# Patient Record
Sex: Female | Born: 1935 | Race: White | Hispanic: No | Marital: Married | Smoking: Never smoker
Health system: Southern US, Community
[De-identification: ages and names within clinical notes are randomized; demographics above are authoritative.]

## PROBLEM LIST (undated history)

## (undated) DIAGNOSIS — E785 Hyperlipidemia, unspecified: Secondary | ICD-10-CM

## (undated) DIAGNOSIS — E079 Disorder of thyroid, unspecified: Secondary | ICD-10-CM

## (undated) DIAGNOSIS — I10 Essential (primary) hypertension: Secondary | ICD-10-CM

## (undated) HISTORY — PX: RHINOPLASTY: SUR1284

## (undated) HISTORY — DX: Hyperlipidemia, unspecified: E78.5

## (undated) HISTORY — DX: Essential (primary) hypertension: I10

## (undated) HISTORY — PX: ABDOMINOPLASTY: SUR9

## (undated) HISTORY — PX: ABDOMINAL HYSTERECTOMY: SHX81

## (undated) HISTORY — PX: THYROIDECTOMY: SHX17

## (undated) HISTORY — DX: Disorder of thyroid, unspecified: E07.9

## (undated) HISTORY — PX: TONSILLECTOMY: SUR1361

---

## 2016-03-18 ENCOUNTER — Ambulatory Visit: Payer: Medicare HMO | Admitting: Allergy

## 2018-01-21 LAB — TSH: TSH: 0.09 — AB (ref 0.41–5.90)

## 2018-03-29 LAB — TSH: TSH: 1.02 (ref 0.41–5.90)

## 2018-05-03 LAB — TSH: TSH: 3.7 (ref 0.41–5.90)

## 2018-06-02 NOTE — Progress Notes (Signed)
Patient ID: Ebony HunterSara Egge, female   DOB: [birth year], 85 y.o.   MRN: [id number]            Referring Provider: Darreld McleanLinda Miles  Reason for Appointment:  Hypothyroidism, new visit    History of Present Illness:   Hypothyroidism was first diagnosed in ?  2014  At the time of diagnosis patient had thyroidectomy done for thyroid nodules but no information is available about her thyroid nodule and why total thyroidectomy was done for a benign diagnosis.  She thinks she was told that she had Hashimoto's thyroiditis also  She says that over the last few years her levothyroxine dose has varied between 75 up to 137 mcg She is concerned that her TSH levels have fluctuated between very low levels up to about 33 but no records are available about her dosage regimen over the last few years  In 2019 her dose has been reduced from previous level of 137 mcg Her TSH was normal at 1.1 previously in 5/19 but unclear what dose she was taking Appears that in 9/19 her TSH was low again and her dose was reduced down to 100 mcg, probably taking 112 mcg prior to this.           The patient has been treated with  100 mcg levothyroxine for about 4 months now With this her last TSH was 3.7  With changing the doses of her thyroid supplementation the patient does not think she has felt different Usually does not have any significant change in her energy level or any symptoms of heat or cold intolerance, palpitations, shakiness or dry skin She is feeling fairly good recently  She thinks that she tends to have some hair loss chronically and splitting of her nails Also she thinks her eyebrows do not grow well  The patient takes the thyroid supplement before breakfast without any other supplement or vitamins as well as usually with a glass of water only         Patient's weight history is as follows:  Wt Readings from Last 3 Encounters:  06/03/18 152 lb 3.2 oz (69 kg)    Thyroid function results have been as  follows:  Lab Results  Component Value Date   TSH 2.20 06/03/2018   TSH 3.70 05/03/2018   TSH 1.02 03/29/2018   TSH 0.09 (A) 01/21/2018   FREET4 0.81 06/03/2018     Past Medical History:  Diagnosis Date  . Hyperlipidemia   . Hypertension   . Thyroid disease     Past Surgical History:  Procedure Laterality Date  . THYROIDECTOMY      Family History  Problem Relation Age of Onset  . Thyroid disease Neg Hx     Social History:  reports that she has never smoked. She has never used smokeless tobacco. She reports that she does not drink alcohol or use drugs.  Allergies: Not on File  Allergies as of 06/03/2018   Not on File     Medication List       Accurate as of June 03, 2018  8:52 PM. Always use your most recent med list.        cholecalciferol 25 MCG (1000 UT) tablet Commonly known as:  VITAMIN D3 Take 2,000 Units by mouth daily. TAKE 2 TABLETS (2000MG ) BY MOUTH ONCE DAILY.   diazepam 10 MG tablet Commonly known as:  VALIUM Take 10 mg by mouth daily as needed for anxiety. TAKE 1 TABLET BY MOUTH ONCE DAILY AS NEEDED.  levothyroxine 100 MCG tablet Commonly known as:  SYNTHROID, LEVOTHROID Take 100 mcg by mouth daily before breakfast. TAKE 1 TABLET BY MOUTH ONCE DAILY.   lisinopril 40 MG tablet Commonly known as:  PRINIVIL,ZESTRIL Take 40 mg by mouth daily. TAKE 1 TABLET BY MOUTH ONCE DAILY.   pravastatin 40 MG tablet Commonly known as:  PRAVACHOL Take 40 mg by mouth daily. TAKE 1 TABLET BY MOUTH ONCE DAILY.          Review of Systems  Constitutional: Positive for weight gain.       She has probably gained about 5 pounds  HENT: Positive for trouble swallowing.        Has mild trouble swallowing.  Also some postnasal drip  Eyes:       Sometimes has discomfort in her eyes  Cardiovascular: Negative for palpitations and leg swelling.  Gastrointestinal: Negative for constipation.       Has had some reflux and reportedly also esophageal spasm.   Currently starting Protonix.  Which she takes before her main meal  Endocrine: Negative for fatigue.       No significant heat intolerance, feels warm at night  Musculoskeletal:       Has mild finger joint pains  Skin: Negative for rash.  Neurological: Negative for numbness.  Psychiatric/Behavioral: Positive for insomnia.                Examination:    BP 122/62 (BP Location: Left Arm, Patient Position: Sitting, Cuff Size: Normal)   Pulse 74   Wt 152 lb 3.2 oz (69 kg)   SpO2 98%   GENERAL:  Average build man well-nourished.   No pallor.    Skin:  no rash or significant skin lesions.  EYES:  No prominence of the eyes or swelling of the eyelids  ENT: Oral mucosa and tongue normal.  NECK: No lymphadenopathy  THYROID:  Not palpable.  HEART:  Normal  S1 and S2; no murmur or click.  CHEST:    Lungs: Vescicular breath sounds heard equally.  No crepitations/ wheeze.  ABDOMEN:  No distention.  Liver and spleen not palpable.  No other mass or tenderness.  NEUROLOGICAL: Reflexes are bilaterally normal at biceps, unable to elicit at ankles.  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 No ankle edema present   Assessment:  HYPOTHYROIDISM secondary to thyroidectomy  She has had variable thyroxine requirements over the last 2 years She appears to be requiring lower doses over the last year and now taking 100 mcg daily Although she is not symptomatic she is concerned about the variability in her TSH levels However unclear how her thyroid supplements have been adjusted based on thyroid levels in the past  She is quite compliant with taking her levothyroxine as directed on empty stomach without any interacting medications or supplements  Other medical problems include hypertension, hyperlipidemia, reflux and chronic insomnia followed by PCP  Patient is requesting screening for diabetes  PLAN:  Recheck thyroid level  today to make sure that it is consistently normal compared to the last 2 levels which were done in November and December Discussed that for her age her TSH being in the upper normal range is adequate and not unexpected Also may consider brand-name medication instead of generic levothyroxine if she has variable thyroid levels  Follow-up to be determined based on labs today   Reather Littlerjay Ziyah Cordoba 06/03/2018, 8:52 PM   Consultation note copy sent to the PCP  Note: This office note was prepared with Insurance underwriter. Any transcriptional errors that result from this process are unintentional.   ADDENDUM: TSH is 2.2.  She will stay on 100 mcg levothyroxine and follow-up in 4 months with same-day labs Blood sugar is 98 and A1c 5.9

## 2018-06-03 ENCOUNTER — Ambulatory Visit (INDEPENDENT_AMBULATORY_CARE_PROVIDER_SITE_OTHER): Payer: Medicare PPO | Admitting: Endocrinology

## 2018-06-03 ENCOUNTER — Encounter: Payer: Self-pay | Admitting: Endocrinology

## 2018-06-03 VITALS — BP 122/62 | HR 74 | Wt 152.2 lb

## 2018-06-03 DIAGNOSIS — Z131 Encounter for screening for diabetes mellitus: Secondary | ICD-10-CM

## 2018-06-03 DIAGNOSIS — E890 Postprocedural hypothyroidism: Secondary | ICD-10-CM

## 2018-06-03 LAB — TSH: TSH: 2.2 u[IU]/mL (ref 0.35–4.50)

## 2018-06-03 LAB — HEMOGLOBIN A1C: HEMOGLOBIN A1C: 5.9 % (ref 4.6–6.5)

## 2018-06-03 LAB — T4, FREE: FREE T4: 0.81 ng/dL (ref 0.60–1.60)

## 2018-06-03 LAB — GLUCOSE, RANDOM: Glucose, Bld: 98 mg/dL (ref 70–99)

## 2018-06-04 ENCOUNTER — Other Ambulatory Visit: Payer: Self-pay

## 2018-10-06 ENCOUNTER — Ambulatory Visit: Payer: Medicare PPO | Admitting: Endocrinology

## 2019-08-22 ENCOUNTER — Ambulatory Visit
Admission: RE | Admit: 2019-08-22 | Discharge: 2019-08-22 | Disposition: A | Discharge status: Home / Self Care | Payer: Medicare PPO | Source: Ambulatory Visit | Attending: Family Medicine | Admitting: Family Medicine

## 2019-08-22 ENCOUNTER — Other Ambulatory Visit: Payer: Self-pay | Admitting: Family Medicine

## 2019-08-22 DIAGNOSIS — M545 Low back pain, unspecified: Secondary | ICD-10-CM

## 2019-09-12 ENCOUNTER — Encounter: Payer: Self-pay | Admitting: Student in an Organized Health Care Education/Training Program

## 2019-09-12 ENCOUNTER — Ambulatory Visit: Payer: Medicare PPO | Admitting: Student in an Organized Health Care Education/Training Program

## 2019-09-12 ENCOUNTER — Other Ambulatory Visit: Payer: Self-pay

## 2019-09-12 ENCOUNTER — Ambulatory Visit
Admission: RE | Admit: 2019-09-12 | Discharge: 2019-09-12 | Disposition: A | Discharge status: Home / Self Care | Payer: Medicare PPO | Source: Ambulatory Visit | Attending: Student in an Organized Health Care Education/Training Program | Admitting: Student in an Organized Health Care Education/Training Program

## 2019-09-12 DIAGNOSIS — M51369 Other intervertebral disc degeneration, lumbar region without mention of lumbar back pain or lower extremity pain: Secondary | ICD-10-CM | POA: Insufficient documentation

## 2019-09-12 DIAGNOSIS — M542 Cervicalgia: Secondary | ICD-10-CM | POA: Insufficient documentation

## 2019-09-12 DIAGNOSIS — M25511 Pain in right shoulder: Secondary | ICD-10-CM | POA: Insufficient documentation

## 2019-09-12 DIAGNOSIS — M5412 Radiculopathy, cervical region: Secondary | ICD-10-CM | POA: Insufficient documentation

## 2019-09-12 DIAGNOSIS — M25512 Pain in left shoulder: Secondary | ICD-10-CM

## 2019-09-12 DIAGNOSIS — M47816 Spondylosis without myelopathy or radiculopathy, lumbar region: Secondary | ICD-10-CM | POA: Insufficient documentation

## 2019-09-12 DIAGNOSIS — G894 Chronic pain syndrome: Secondary | ICD-10-CM | POA: Insufficient documentation

## 2019-09-12 DIAGNOSIS — M5136 Other intervertebral disc degeneration, lumbar region: Secondary | ICD-10-CM

## 2019-09-12 MED ORDER — TRAMADOL HCL 50 MG PO TABS: 50.0000 mg | ORAL_TABLET | Freq: Every day | ORAL | 0 refills | Status: AC | PRN

## 2019-09-12 MED ORDER — GABAPENTIN 300 MG PO CAPS: 300.0000 mg | ORAL_CAPSULE | Freq: Every day | ORAL | 1 refills | Status: DC

## 2019-09-12 NOTE — Progress Notes (Signed)
Patient: Ebony Anderson  Service Category: E/M  Provider: Gillis Santa, MD  DOB: [birth year]  DOS: 09/12/2019  Referring Provider: Marguerita Merles, MD  MRN: [id number]  Setting: Ambulatory outpatient  PCP: Marguerita Merles, MD  Type: New Patient  Specialty: Interventional Pain Management    Location: Office  Delivery: Face-to-face     Primary Reason(s) for Visit: Encounter for initial evaluation of one or more chronic problems (new to examiner) potentially causing chronic pain, and posing a threat to normal musculoskeletal function. (Level of risk: High) CC: Back Pain (bilateral lumbar )  HPI  Ms. Ebony Anderson is a 86 y.o. year old, female patient, who comes today to see [place] for the first time for an initial evaluation of her chronic pain. She has Lumbar spondylosis; Lumbar degenerative disc disease; Chronic pain syndrome; Neck pain; and Pain of both shoulder joints on their problem list. Today she comes in for evaluation of her Back Pain (bilateral lumbar )  Pain Assessment: Location: Lower, Left, Right Back Radiating: denies Onset: More than a month ago Duration: Chronic pain Quality: Discomfort, Constant(excruciating) Severity: 10-Worst pain ever/10 (subjective, self-reported pain score)  Note: Reported level is inconsistent with clinical observations.                         When using our objective Pain Scale, levels between 6 and 10/10 are said to belong in an emergency room, as it progressively worsens from a 6/10, described as severely limiting, requiring emergency care not usually available at an outpatient pain management facility. At a 6/10 level, communication becomes difficult and requires great effort. Assistance to reach the emergency department may be required. Facial flushing and profuse sweating along with potentially dangerous increases in heart rate and blood pressure will be evident. Effect on ADL: unable to stand for very long. Timing: Constant Modifying factors: nothing currently BP:     HR:    Onset and Duration: Gradual and Present longer than 3 months Cause of pain: Unknown Severity: Getting worse, NAS-11 at its worse: 10/10, NAS-11 at its best: 10/10, NAS-11 now: 10/10 and NAS-11 on the average: 10/10 Timing: During activity or exercise and After activity or exercise Aggravating Factors: Bending and Prolonged standing Alleviating Factors: Lying down, Resting and Warm showers or baths Associated Problems: Day-time cramps and Night-time cramps Quality of Pain: Aching, Agonizing, Annoying, Intermittent, Cramping and Distressing Previous Examinations or Tests: CT scan Previous Treatments: Pool exercises  The patient comes into the clinics today for the first time for a chronic pain management evaluation.    Ms. Ebony Anderson is a very pleasant 86 year old female who presents with a chief complaint of axial low back pain that does not radiate into her buttocks or bilateral legs.  This has been present for many years.  She states that she sustained a coccyx fracture during labor with her first newborn.  She states that this is always been an area that has been painful for her but now she is having more superior pain in her low back.  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  She has been utilizing Tylenol and ibuprofen as needed.  She states that standing for more than 15 minutes causes her to have pain.  Bending forward helps alleviate her pain.  She denies having tried gabapentin.  She has done home physical therapy in the past which she states was somewhat helpful in helping to build her muscle strength.  She has done aquatic  therapy in the past as well.  She denies having tried any injections, she denies having tried gabapentin in the past.  She states that she has found benefit with tramadol.  She had an old prescription she states that she takes 1 when she is going outside of the house to do chores.  She denies any bowel bladder weakness.  She is on Valium 10 mg daily.  This is  prescribed by Dr. Lennox Grumbles.  Historic Controlled Substance Pharmacotherapy Review   The patient  reports no history of drug use. List of all UDS Test(s): No results found for: MDMA, COCAINSCRNUR, [place], [place], CANNABQUANT, THCU, [place] List of other Serum/Urine Drug Screening Test(s):  No results found for: AMPHSCRSER, BARBSCRSER, BENZOSCRSER, COCAINSCRSER, COCAINSCRNUR, PCPSCRSER, PCPQUANT, THCSCRSER, THCU, CANNABQUANT, OPIATESCRSER, OXYSCRSER, PROPOXSCRSER, ETH Historical Background Evaluation: [place] PMP: PDMP reviewed during this encounter. Six (6) year initial data search conducted.              [place] Department of public safety, offender search: Editor, commissioning Information) Non-contributory Risk Assessment Profile: Aberrant behavior: None observed or detected today Risk factors for fatal opioid overdose: concomitant use of Benzodiazepines Fatal overdose hazard ratio (HR): Calculation deferred Non-fatal overdose hazard ratio (HR): Calculation deferred Risk of opioid abuse or dependence: 0.7-3.0% with doses ? 36 MME/day and 6.1-26% with doses ? 120 MME/day. Substance use disorder (SUD) risk level: See below Personal History of Substance Abuse (SUD-Substance use disorder):  Alcohol: Negative  Illegal Drugs: Negative  Rx Drugs: Negative  ORT Risk Level calculation: Low Risk Opioid Risk Tool - 09/12/19 1315      Family History of Substance Abuse   Alcohol  Positive Female    Illegal Drugs  Negative    Rx Drugs  Negative      Personal History of Substance Abuse   Alcohol  Negative    Illegal Drugs  Negative    Rx Drugs  Negative      Age   Age between 84-45 years   No      Psychological Disease   Psychological Disease  Negative    Depression  Negative      Total Score   Opioid Risk Tool Scoring  1    Opioid Risk Interpretation  Low Risk      ORT Scoring interpretation table:  Score <3 = Low Risk for SUD  Score between 4-7 = Moderate Risk for SUD  Score >8 = High Risk for Opioid  Abuse   PHQ-2 Depression Scale:  Total score:    PHQ-2 Scoring interpretation table: (Score and probability of major depressive disorder)  Score 0 = No depression  Score 1 = 15.4% Probability  Score 2 = 21.1% Probability  Score 3 = 38.4% Probability  Score 4 = 45.5% Probability  Score 5 = 56.4% Probability  Score 6 = 78.6% Probability   PHQ-9 Depression Scale:  Total score:    PHQ-9 Scoring interpretation table:  Score 0-4 = No depression  Score 5-9 = Mild depression  Score 10-14 = Moderate depression  Score 15-19 = Moderately severe depression  Score 20-27 = Severe depression (2.4 times higher risk of SUD and 2.89 times higher risk of overuse)   Pharmacologic Plan: As per protocol, I have not taken over any controlled substance management, pending the results of ordered tests and/or consults.            Initial impression: Pending review of available data and ordered tests.  Meds   Current Outpatient Medications:  .  cholecalciferol (VITAMIN D3) 25 MCG (1000 UT) tablet, Take 4,000 Units by mouth daily. TAKE 2 TABLETS (2000MG) BY MOUTH ONCE DAILY. , Disp: , Rfl:  .  diazepam (VALIUM) 10 MG tablet, Take 10 mg by mouth daily as needed for anxiety. TAKE 1 TABLET BY MOUTH ONCE DAILY AS NEEDED., Disp: , Rfl:  .  levothyroxine (SYNTHROID, LEVOTHROID) 100 MCG tablet, Take 112 mcg by mouth daily before breakfast. TAKE 1 TABLET BY MOUTH ONCE DAILY. , Disp: , Rfl:  .  lisinopril (PRINIVIL,ZESTRIL) 40 MG tablet, Take 40 mg by mouth daily. TAKE 1 TABLET BY MOUTH ONCE DAILY., Disp: , Rfl:  .  pravastatin (PRAVACHOL) 40 MG tablet, Take 40 mg by mouth daily. TAKE 1 TABLET BY MOUTH ONCE DAILY., Disp: , Rfl:  .  esomeprazole (NEXIUM) 40 MG capsule, Take 40 mg by mouth daily., Disp: , Rfl:  .  gabapentin (NEURONTIN) 300 MG capsule, Take 1 capsule (300 mg total) by mouth at bedtime., Disp: 30 capsule, Rfl: 1 .  ketoconazole (NIZORAL) 2 % shampoo, Apply 1 application topically every other day., Disp:  , Rfl:  .  magnesium oxide (MAG-OX) 400 MG tablet, Take 400 mg by mouth as needed., Disp: , Rfl:  .  mometasone (ELOCON) 0.1 % lotion, Apply 0.1 application topically daily., Disp: , Rfl:  .  traMADol (ULTRAM) 50 MG tablet, Take 1 tablet (50 mg total) by mouth daily as needed for severe pain. Month last 30 days., Disp: 30 tablet, Rfl: 0  Imaging Review    Results for orders placed during the hospital encounter of 08/22/19  DG Lumbar Spine Complete   Narrative CLINICAL DATA:  Low back pain and sciatica  EXAM: LUMBAR SPINE - COMPLETE 4+ VIEW  COMPARISON:  None.  FINDINGS: Mild curvature of the thoracolumbar spine convex left centered at the thoracolumbar junction. Mild spondylosis throughout the lumbar spine most notable at the L2-3 level. Moderate facet arthropathy over the lower lumbar spine. No evidence of compression fracture or subluxation. Moderate disc space narrowing at the L2-3 level and to lesser extent at the L3-4 level. Mild calcified plaque over the abdominal aorta and iliac arteries.  IMPRESSION: Mild spondylosis of the lumbar spine to include moderate facet arthropathy over the lower lumbar spine. Moderate disc space narrowing at the L2-3 level and to lesser extent at the L3-4 level.   Electronically Signed   By: Marin Olp M.D.   On: 08/23/2019 08:14            Complexity Note: Imaging results reviewed. Results shared with Ms. Ebony Anderson, using Layman's terms.                         ROS  Cardiovascular: Needs antibiotics prior to dental procedures Pulmonary or Respiratory: Snoring  Neurological: No reported neurological signs or symptoms such as seizures, abnormal skin sensations, urinary and/or fecal incontinence, being born with an abnormal open spine and/or a tethered spinal cord Psychological-Psychiatric: No reported psychological or psychiatric signs or symptoms such as difficulty sleeping, anxiety, depression, delusions or hallucinations  (schizophrenial), mood swings (bipolar disorders) or suicidal ideations or attempts Gastrointestinal: Heartburn due to stomach pushing into lungs (Hiatal hernia) and Reflux or heatburn Genitourinary: No reported renal or genitourinary signs or symptoms such as difficulty voiding or producing urine, peeing blood, non-functioning kidney, kidney stones, difficulty emptying the bladder, difficulty controlling the flow of urine, or chronic kidney disease Hematological: No reported hematological signs or symptoms such as prolonged bleeding, low  or poor functioning platelets, bruising or bleeding easily, hereditary bleeding problems, low energy levels due to low hemoglobin or being anemic Endocrine: thyroid removal  Rheumatologic: Rheumatoid arthritis Musculoskeletal: Negative for myasthenia gravis, muscular dystrophy, multiple sclerosis or malignant hyperthermia Work History: Retired  Allergies  Ms. Ebony Anderson is allergic to aspirin; codeine; and sulfa antibiotics.  Laboratory Chemistry Profile   Renal No results found for: BUN, CREATININE, LABCREA, BCR, GFR, GFRAA, GFRNONAA, SPECGRAV, PHUR, PROTEINUR   Electrolytes No results found for: NA, K, CL, CALCIUM, MG, PHOS   Hepatic No results found for: AST, ALT, ALBUMIN, ALKPHOS, AMYLASE, LIPASE, AMMONIA   ID No results found for: LYMEIGGIGMAB, HIV, SARSCOV2NAA, STAPHAUREUS, MRSAPCR, HCVAB, PREGTESTUR, RMSFIGG, QFVRPH1IGG, QFVRPH2IGG, LYMEIGGIGMAB   Bone No results found for: VD25OH, VE720NO7SJG, GE3662HU7, ML4650PT4, 25OHVITD1, 25OHVITD2, 25OHVITD3, TESTOFREE, TESTOSTERONE   Endocrine Lab Results  Component Value Date   GLUCOSE 98 06/03/2018   HGBA1C 5.9 06/03/2018   TSH 2.20 06/03/2018   FREET4 0.81 06/03/2018     Neuropathy Lab Results  Component Value Date   HGBA1C 5.9 06/03/2018     CNS No results found for: COLORCSF, APPEARCSF, RBCCOUNTCSF, WBCCSF, POLYSCSF, LYMPHSCSF, EOSCSF, PROTEINCSF, GLUCCSF, JCVIRUS, CSFOLI, IGGCSF, LABACHR,  ACETBL, LABACHR, ACETBL   Inflammation (CRP: Acute  ESR: Chronic) No results found for: CRP, ESRSEDRATE, LATICACIDVEN   Rheumatology No results found for: RF, ANA, LABURIC, URICUR, LYMEIGGIGMAB, LYMEABIGMQN, HLAB27   Coagulation No results found for: INR, LABPROT, APTT, PLT, DDIMER, LABHEMA, VITAMINK1, AT3   Cardiovascular No results found for: BNP, CKTOTAL, CKMB, TROPONINI, HGB, HCT, LABVMA, EPIRU, EPINEPH24HUR, NOREPRU, NOREPI24HUR, DOPARU, DOPAM24HRUR   Screening No results found for: SARSCOV2NAA, COVIDSOURCE, STAPHAUREUS, MRSAPCR, HCVAB, HIV, PREGTESTUR   Cancer No results found for: CEA, CA125, LABCA2   Allergens No results found for: ALMOND, APPLE, ASPARAGUS, AVOCADO, BANANA, BARLEY, BASIL, B[place]LE[place], GREENBEAN, LIMABEAN, WHITEBEAN, BEEFIGE, REDBEET, BLUEBERRY, BROCCOLI, CABBAGE, MELON, CARROT, CASEIN, CASHEWNUT, CAULIFLOWER, CELERY     Note: Lab results reviewed.   [place]  Drug: Ms. Ebony Anderson  reports no history of drug use. Alcohol:  reports no history of alcohol use. Tobacco:  reports that she has never smoked. She has never used smokeless tobacco. Medical:  has a past medical history of Hyperlipidemia, Hypertension, and Thyroid disease. Family: family history is not on file.  Past Surgical History:  Procedure Laterality Date  . THYROIDECTOMY     Active Ambulatory Problems    Diagnosis Date Noted  . Lumbar spondylosis 09/12/2019  . Lumbar degenerative disc disease 09/12/2019  . Chronic pain syndrome 09/12/2019  . Neck pain 09/12/2019  . Pain of both shoulder joints 09/12/2019   Resolved Ambulatory Problems    Diagnosis Date Noted  . No Resolved Ambulatory Problems   Past Medical History:  Diagnosis Date  . Hyperlipidemia   . Hypertension   . Thyroid disease    Constitutional Exam  General appearance: Well nourished, well developed, and well hydrated. In no apparent acute distress There were no vitals filed for this visit. BMI Assessment: There is no height  or weight on file to calculate BMI.  BMI interpretation table: BMI level Category Range association with higher incidence of chronic pain  <18 kg/m2 Underweight   18.5-24.9 kg/m2 Ideal body weight   25-29.9 kg/m2 Overweight Increased incidence by 20%  30-34.9 kg/m2 Obese (Class I) Increased incidence by 68%  35-39.9 kg/m2 Severe obesity (Class II) Increased incidence by 136%  >40 kg/m2 Extreme obesity (Class III) Increased incidence by 254%   Patient's current BMI Ideal Body weight  There is no height or weight on file to calculate BMI. Patient weight not recorded   BMI Readings from Last 4 Encounters:  No data found for BMI   Wt Readings from Last 4 Encounters:  06/03/18 152 lb 3.2 oz (69 kg)    Psych/Mental status: Alert, oriented x 3 (person, place, & time)       Eyes: PERLA Respiratory: No evidence of acute respiratory distress  Cervical Spine Exam  Skin & Axial Inspection: No masses, redness, edema, swelling, or associated skin lesions Alignment: Symmetrical Functional ROM: Unrestricted ROM      Stability: No instability detected Muscle Tone/Strength: Functionally intact. No obvious neuro-muscular anomalies detected. Sensory (Neurological): Unimpaired Palpation: No palpable anomalies              Upper Extremity (UE) Exam    Side: Right upper extremity  Side: Left upper extremity  Skin & Extremity Inspection: Skin color, temperature, and hair growth are WNL. No peripheral edema or cyanosis. No masses, redness, swelling, asymmetry, or associated skin lesions. No contractures.  Skin & Extremity Inspection: Skin color, temperature, and hair growth are WNL. No peripheral edema or cyanosis. No masses, redness, swelling, asymmetry, or associated skin lesions. No contractures.  Functional ROM: Unrestricted ROM          Functional ROM: Unrestricted ROM          Muscle Tone/Strength: Functionally intact. No obvious neuro-muscular anomalies detected.  Muscle Tone/Strength:  Functionally intact. No obvious neuro-muscular anomalies detected.  Sensory (Neurological): Unimpaired          Sensory (Neurological): Unimpaired          Palpation: No palpable anomalies              Palpation: No palpable anomalies              Provocative Test(s):  Phalen's test: deferred Tinel's test: deferred Apley's scratch test (touch opposite shoulder):  Action 1 (Across chest): deferred Action 2 (Overhead): deferred Action 3 (LB reach): deferred   Provocative Test(s):  Phalen's test: deferred Tinel's test: deferred Apley's scratch test (touch opposite shoulder):  Action 1 (Across chest): deferred Action 2 (Overhead): deferred Action 3 (LB reach): deferred    Thoracic Spine Area Exam  Skin & Axial Inspection: No masses, redness, or swelling Alignment: Symmetrical Functional ROM: Unrestricted ROM Stability: No instability detected Muscle Tone/Strength: Functionally intact. No obvious neuro-muscular anomalies detected. Sensory (Neurological): Unimpaired Muscle strength & Tone: No palpable anomalies  Lumbar Exam  Skin & Axial Inspection: No masses, redness, or swelling Alignment: Symmetrical Functional ROM: Pain restricted ROM affecting both sides Stability: No instability detected Muscle Tone/Strength: Functionally intact. No obvious neuro-muscular anomalies detected. Sensory (Neurological): Musculoskeletal pain pattern Palpation: No palpable anomalies       Provocative Tests: Hyperextension/rotation test: (+) bilaterally for facet joint pain. Lumbar quadrant test (Kemp's test): (+) bilaterally for facet joint pain. Lateral bending test: deferred today       Patrick's Maneuver: deferred today                   FABER* test: deferred today                   S-I anterior distraction/compression test: deferred today         S-I lateral compression test: deferred today         S-I Thigh-thrust test: deferred today         S-I Gaenslen's test: deferred today          *(  Flexion, ABduction and External Rotation)  Gait & Posture Assessment  Ambulation: Unassisted Gait: Relatively normal for age and body habitus Posture: WNL   Lower Extremity Exam    Side: Right lower extremity  Side: Left lower extremity  Stability: No instability observed          Stability: No instability observed          Skin & Extremity Inspection: Skin color, temperature, and hair growth are WNL. No peripheral edema or cyanosis. No masses, redness, swelling, asymmetry, or associated skin lesions. No contractures.  Skin & Extremity Inspection: Skin color, temperature, and hair growth are WNL. No peripheral edema or cyanosis. No masses, redness, swelling, asymmetry, or associated skin lesions. No contractures.  Functional ROM: Pain restricted ROM for hip and knee joints          Functional ROM: Pain restricted ROM for hip and knee joints          Muscle Tone/Strength: Functionally intact. No obvious neuro-muscular anomalies detected.  Muscle Tone/Strength: Functionally intact. No obvious neuro-muscular anomalies detected.  Sensory (Neurological): Unimpaired        Sensory (Neurological): Unimpaired        DTR: Patellar: 0: absent Achilles: deferred today Plantar: deferred today  DTR: Patellar: 0: absent Achilles: deferred today Plantar: deferred today  Palpation: No palpable anomalies  Palpation: No palpable anomalies   Assessment  Primary Diagnosis & Pertinent Problem List: The primary encounter diagnosis was Lumbar facet arthropathy. Diagnoses of Lumbar spondylosis, Lumbar degenerative disc disease, Neck pain, Pain of both shoulder joints, and Chronic pain syndrome were also pertinent to this visit.  Visit Diagnosis (New problems to examiner): 1. Lumbar facet arthropathy   2. Lumbar spondylosis   3. Lumbar degenerative disc disease   4. Neck pain   5. Pain of both shoulder joints   6. Chronic pain syndrome    General Recommendations: The pain condition that the patient  suffers from is best treated with a multidisciplinary approach that involves an increase in physical activity to prevent de-conditioning and worsening of the pain cycle, as well as psychological counseling (formal and/or informal) to address the co-morbid psychological affects of pain. Treatment will often involve judicious use of pain medications and interventional procedures to decrease the pain, allowing the patient to participate in the physical activity that will ultimately produce long-lasting pain reductions. The goal of the multidisciplinary approach is to return the patient to a higher level of overall function and to restore their ability to perform activities of daily living.  Plan of Care (Initial workup plan)   1. Lumbar facet arthropathy Ebony Anderson has a history of greater than 3 months of moderate to severe pain which is resulted in functional impairment.  The patient has tried various conservative therapeutic options such as NSAIDs, Tylenol, muscle relaxants, physical therapy which was inadequately effective.  Patient's pain is predominantly axial with physical exam findings suggestive of facet arthropathy.  Lumbar facet medial branch nerve blocks were discussed with the patient.  Risks and benefits were reviewed.  Patient would like to proceed with bilateral L3, L4, L5 medial branch nerve block.  - LUMBAR FACET(MEDIAL BRANCH NERVE BLOCK) MBNB; Future  2. Lumbar spondylosis -as above  3. Lumbar degenerative disc disease -continue with home PT exercise  4. Neck pain - DG Cervical Spine With Flex & Extend; Future  5. Pain of both shoulder joints - DG Shoulder Right; Future - DG Shoulder Left; Future  6. Chronic pain syndrome - Compliance Drug Analysis,  Ur - gabapentin (NEURONTIN) 300 MG capsule; Take 1 capsule (300 mg total) by mouth at bedtime.  Dispense: 30 capsule; Refill: 1 - traMADol (ULTRAM) 50 MG tablet; Take 1 tablet (50 mg total) by mouth daily as needed for severe  pain. Month last 30 days.  Dispense: 30 tablet; Refill: 0   Lab Orders     Compliance Drug Analysis, Ur  Imaging Orders     DG Cervical Spine With Flex & Extend     DG Shoulder Right     DG Shoulder Left  Procedure Orders     LUMBAR FACET(MEDIAL BRANCH NERVE BLOCK) MBNB Pharmacotherapy (current): Medications ordered:  Meds ordered this encounter  Medications  . gabapentin (NEURONTIN) 300 MG capsule    Sig: Take 1 capsule (300 mg total) by mouth at bedtime.    Dispense:  30 capsule    Refill:  1  . traMADol (ULTRAM) 50 MG tablet    Sig: Take 1 tablet (50 mg total) by mouth daily as needed for severe pain. Month last 30 days.    Dispense:  30 tablet    Refill:  0    [place] STOP ACT - Not applicable. Fill one day early if pharmacy is closed on scheduled refill date.   Medications administered during this visit: Ritta Ebony Anderson had no medications administered during this visit.   Pharmacological management options:  Opioid Analgesics: The patient was informed that there is no guarantee that she would be a candidate for opioid analgesics. The decision will be made following CDC guidelines. This decision will be based on the results of diagnostic studies, as well as Ms. Ebony Anderson's risk profile.   Membrane stabilizer: To be determined at a later time  Muscle relaxant: To be determined at a later time  NSAID: To be determined at a later time  Other analgesic(s): To be determined at a later time   Interventional management options: Ms. Ebony Anderson was informed that there is no guarantee that she would be a candidate for interventional therapies. The decision will be based on the results of diagnostic studies, as well as Ms. Ebony Anderson's risk profile.  Procedure(s) under consideration:  Lumbar facet medial branch nerve block Pending cervical and shoulder x-rays consider cervical facets, glenohumeral steroid joint, suprascapular nerve block   Provider-requested follow-up: Return in about 2 weeks  (around 09/26/2019) for B/L L3, 4, 5 Facets  , without sedation.  No future appointments.  Note by: Gillis Santa, MD Date: 09/12/2019; Time: 3:22 PM

## 2019-09-12 NOTE — Progress Notes (Signed)
Safety precautions to be maintained throughout the outpatient stay will include: orient to surroundings, keep bed in low position, maintain call bell within reach at all times, provide assistance with transfer out of bed and ambulation.  

## 2019-09-12 NOTE — Patient Instructions (Signed)
____________________________________________________________________________________________  Preparing for your procedure (without sedation)  Procedure appointments are limited to planned procedures: . No Prescription Refills. . No disability issues will be discussed. . No medication changes will be discussed.  Instructions: . Oral Intake: Do not eat or drink anything for at least 3 hours prior to your procedure. (Exception: Blood Pressure Medication. See below.) . Transportation: Unless otherwise stated by your physician, you may drive yourself after the procedure. . Blood Pressure Medicine: Do not forget to take your blood pressure medicine with a sip of water the morning of the procedure. If your Diastolic (lower reading)is above 100 mmHg, elective cases will be cancelled/rescheduled. . Blood thinners: These will need to be stopped for procedures. Notify our staff if you are taking any blood thinners. Depending on which one you take, there will be specific instructions on how and when to stop it. . Diabetics on insulin: Notify the staff so that you can be scheduled 1st case in the morning. If your diabetes requires high dose insulin, take only  of your normal insulin dose the morning of the procedure and notify the staff that you have done so. . Preventing infections: Shower with an antibacterial soap the morning of your procedure.  . Build-up your immune system: Take 1000 mg of Vitamin C with every meal (3 times a day) the day prior to your procedure. . Antibiotics: Inform the staff if you have a condition or reason that requires you to take antibiotics before dental procedures. . Pregnancy: If you are pregnant, call and cancel the procedure. . Sickness: If you have a cold, fever, or any active infections, call and cancel the procedure. . Arrival: You must be in the facility at least 30 minutes prior to your scheduled procedure. . Children: Do not bring any children with you. . Dress  appropriately: Bring dark clothing that you would not mind if they get stained. . Valuables: Do not bring any jewelry or valuables.  Reasons to call and reschedule or cancel your procedure: (Following these recommendations will minimize the risk of a serious complication.) . Surgeries: Avoid having procedures within 2 weeks of any surgery. (Avoid for 2 weeks before or after any surgery). . Flu Shots: Avoid having procedures within 2 weeks of a flu shots or . (Avoid for 2 weeks before or after immunizations). . Barium: Avoid having a procedure within 7-10 days after having had a radiological study involving the use of radiological contrast. (Myelograms, Barium swallow or enema study). . Heart attacks: Avoid any elective procedures or surgeries for the initial 6 months after a "Myocardial Infarction" (Heart Attack). . Blood thinners: It is imperative that you stop these medications before procedures. Let us know if you if you take any blood thinner.  . Infection: Avoid procedures during or within two weeks of an infection (including chest colds or gastrointestinal problems). Symptoms associated with infections include: Localized redness, fever, chills, night sweats or profuse sweating, burning sensation when voiding, cough, congestion, stuffiness, runny nose, sore throat, diarrhea, nausea, vomiting, cold or Flu symptoms, recent or current infections. It is specially important if the infection is over the area that we intend to treat. . Heart and lung problems: Symptoms that may suggest an active cardiopulmonary problem include: cough, chest pain, breathing difficulties or shortness of breath, dizziness, ankle swelling, uncontrolled high or unusually low blood pressure, and/or palpitations. If you are experiencing any of these symptoms, cancel your procedure and contact your primary care physician for an evaluation.  Remember:  Regular   Business hours are:  Monday to Thursday 8:00 AM to 4:00  PM  Provider's Schedule: Milinda Pointer, MD:  Procedure days: Tuesday and Thursday 7:30 AM to 4:00 PM  Gillis Santa, MD:  Procedure days: Monday and Wednesday 7:30 AM to 4:00 PM ____________________________________________________________________________________________   Facet Joint Block The facet joints connect the bones of the spine (vertebrae). They make it possible for you to bend, twist, and make other movements with your spine. They also keep you from bending too far, twisting too far, and making other extreme movements. A facet joint block is a procedure in which a numbing medicine (anesthetic) is injected into a facet joint. In many cases, an anti-inflammatory medicine (steroid) is also injected. A facet joint block may be done:  To diagnose neck or back pain. If the pain gets better after a facet joint block, it means the pain is probably coming from the facet joint. If the pain does not get better, it means the pain is probably not coming from the facet joint.  To relieve neck or back pain that is caused by an inflamed facet joint. A facet joint block is only done to relieve pain if the pain does not improve with other methods, such as medicine, exercise programs, and physical therapy. Tell a health care provider about:  Any allergies you have.  All medicines you are taking, including vitamins, herbs, eye drops, creams, and over-the-counter medicines.  Any problems you or family members have had with anesthetic medicines.  Any blood disorders you have.  Any surgeries you have had.  Any medical conditions you have or have had.  Whether you are pregnant or may be pregnant. What are the risks? Generally, this is a safe procedure. However, problems may occur, including:  Bleeding.  Injury to a nerve near the injection site.  Pain at the injection site.  Weakness or numbness in areas controlled by nerves near the injection site.  Infection.  Temporary fluid  retention.  Allergic reactions to medicines or dyes.  Injury to other structures or organs near the injection site. What happens before the procedure? Medicines Ask your health care provider about:  Changing or stopping your regular medicines. This is especially important if you are taking diabetes medicines or blood thinners.  Taking medicines such as aspirin and ibuprofen. These medicines can thin your blood. Do not take these medicines unless your health care provider tells you to take them.  Taking over-the-counter medicines, vitamins, herbs, and supplements. Eating and drinking Follow instructions from your health care provider about eating and drinking, which may include:  8 hours before the procedure - stop eating heavy meals or foods, such as meat, fried foods, or fatty foods.  6 hours before the procedure - stop eating light meals or foods, such as toast or cereal.  6 hours before the procedure - stop drinking milk or drinks that contain milk.  2 hours before the procedure - stop drinking clear liquids. Staying hydrated Follow instructions from your health care provider about hydration, which may include:  Up to 2 hours before the procedure - you may continue to drink clear liquids, such as water, clear fruit juice, black coffee, and plain tea. General instructions  Do not use any products that contain nicotine or tobacco for at least 4-6 weeks before the procedure. These products include cigarettes, e-cigarettes, and chewing tobacco. If you need help quitting, ask your health care provider.  Plan to have someone take you home from the hospital or clinic.  Ask your health care provider: ? How your surgery site will be marked. ? What steps will be taken to help prevent infection. These may include:  Removing hair at the surgery site.  Washing skin with a germ-killing soap.  Receiving antibiotic medicine. What happens during the procedure?   You will put on a  hospital gown.  You will lie on your stomach on an X-ray table. You may be asked to lie in a different position if an injection will be made in your neck.  Machines will be used to monitor your oxygen levels, heart rate, and blood pressure.  Your skin will be cleaned.  If an injection will be made in your neck, an IV will be inserted into one of your veins. Fluids and medicine will flow directly into your body through the IV.  A numbing medicine (local anesthetic) will be applied to your skin. Your skin may sting or burn for a moment.  A video X-ray machine (fluoroscopy) will be used to find the joint. In some cases, a CT scan may be used.  A contrast dye may be injected into the facet joint area to help find the joint.  When the joint is located, an anesthetic will be injected into the joint through the needle.  Your health care provider will ask you whether you feel pain relief. ? If you feel relief, a steroid may be injected to provide pain relief for a longer period of time. ? If you do not feel relief or feel only partial relief, additional injections of an anesthetic may be made in other facet joints.  The needle will be removed.  Your skin will be cleaned.  A bandage (dressing) will be applied over each injection site. The procedure may vary among health care providers and hospitals. What happens after the procedure?  Your blood pressure, heart rate, breathing rate, and blood oxygen level will be monitored until you leave the hospital or clinic.  You will lie down and rest for a period of time. Summary  A facet joint block is a procedure in which a numbing medicine (anesthetic) is injected into a facet joint. An anti-inflammatory medicine (stereoid) may also be injected.  Follow instructions from your health care provider about medicines and eating and drinking before the procedure.  Do not use any products that contain nicotine or tobacco for at least 4-6 weeks before  the procedure.  You will lie on your stomach for the procedure, but you may be asked to lie in a different position if an injection will be made in your neck.  When the joint is located, an anesthetic will be injected into the joint through the needle. This information is not intended to replace advice given to you by your health care provider. Make sure you discuss any questions you have with your health care provider. Document Revised: 08/19/2018 Document Reviewed: 04/02/2018 [employer] Patient Education  [place].

## 2019-09-13 ENCOUNTER — Telehealth: Payer: Self-pay

## 2019-09-14 NOTE — Telephone Encounter (Signed)
Patient had questions regarding script. They only gave her a few tramadol

## 2019-09-15 ENCOUNTER — Ambulatory Visit: Payer: Medicare PPO | Admitting: Student in an Organized Health Care Education/Training Program

## 2019-09-15 LAB — COMPLIANCE DRUG ANALYSIS, UR

## 2019-09-26 ENCOUNTER — Ambulatory Visit
Admission: RE | Admit: 2019-09-26 | Discharge: 2019-09-26 | Disposition: A | Discharge status: Home / Self Care | Payer: Medicare PPO | Source: Ambulatory Visit | Attending: Student in an Organized Health Care Education/Training Program | Admitting: Student in an Organized Health Care Education/Training Program

## 2019-09-26 ENCOUNTER — Ambulatory Visit (HOSPITAL_BASED_OUTPATIENT_CLINIC_OR_DEPARTMENT_OTHER): Payer: Medicare PPO | Admitting: Student in an Organized Health Care Education/Training Program

## 2019-09-26 ENCOUNTER — Other Ambulatory Visit: Payer: Self-pay

## 2019-09-26 ENCOUNTER — Encounter: Payer: Self-pay | Admitting: Student in an Organized Health Care Education/Training Program

## 2019-09-26 DIAGNOSIS — M47816 Spondylosis without myelopathy or radiculopathy, lumbar region: Secondary | ICD-10-CM | POA: Insufficient documentation

## 2019-09-26 MED ORDER — ROPIVACAINE HCL 2 MG/ML IJ SOLN
9.0000 mL | Freq: Once | INTRAMUSCULAR | Status: AC
  Administered 2019-09-26: 9 mL via PERINEURAL
  Filled 2019-09-26: qty 10

## 2019-09-26 MED ORDER — DEXAMETHASONE SODIUM PHOSPHATE 10 MG/ML IJ SOLN
10.0000 mg | Freq: Once | INTRAMUSCULAR | Status: AC
  Administered 2019-09-26: 10 mg
  Filled 2019-09-26: qty 1

## 2019-09-26 MED ORDER — LIDOCAINE HCL 2 % IJ SOLN
20.0000 mL | Freq: Once | INTRAMUSCULAR | Status: AC
  Administered 2019-09-26: 400 mg
  Filled 2019-09-26: qty 40

## 2019-09-26 NOTE — Progress Notes (Signed)
PROVIDER NOTE: Information contained herein reflects review and annotations entered in association with encounter. Interpretation of such information and data should be left to medically-trained personnel. Information provided to patient can be located elsewhere in the medical record under "Patient Instructions". Document created using STT-dictation technology, any transcriptional errors that may result from process are unintentional.    Patient: Ebony Anderson  Service Category: Procedure  Provider: Gillis Santa, MD  DOB: [birth year]  DOS: 09/26/2019  Location: [facility]  MRN: [id number]  Setting: Ambulatory - outpatient  Referring Provider: Gillis Santa, MD  Type: Established Patient  Specialty: Interventional Pain Management  PCP: Marguerita Merles, MD   Primary Reason for Visit: Interventional Pain Management Treatment. CC: Back Pain  Procedure:          Anesthesia, Analgesia, Anxiolysis:  Type: Lumbar Facet, Medial Branch Block(s) #1  Primary Purpose: Diagnostic Region: Posterolateral Lumbosacral Spine Level: L3, L4, L5,  Medial Branch Level(s). Injecting these levels blocks the L3-4, L4-5, lumbar facet joints. Laterality: Bilateral  Type: Local Anesthesia   Local Anesthetic: Lidocaine 1-2%  Position: Prone   Indications: 1. Lumbar facet arthropathy    Pain Score: Pre-procedure: 3 /10 Post-procedure: 3 /10   Pre-op Assessment:  Ebony Anderson is a 86 y.o. (year old), female patient, seen today for interventional treatment. She  has a past surgical history that includes Thyroidectomy. Ebony Anderson has a current medication list which includes the following prescription(s): cholecalciferol, diazepam, esomeprazole, ketoconazole, levothyroxine, lisinopril, magnesium oxide, pravastatin, tramadol, gabapentin, and mometasone. Her primarily concern today is the Back Pain  Initial Vital Signs:  Pulse/HCG Rate: 89ECG Heart Rate: 96 Temp: 97.6 F (36.4 C) Resp: (!) 22 BP: (!)  124/54 SpO2: 100 %  BMI: Estimated body mass index is 25.06 kg/m as calculated from the following:   Height as of this encounter: 5\' 4"  (1.626 m).   Weight as of this encounter: 146 lb (66.2 kg).  Risk Assessment: Allergies: Reviewed. She is allergic to aspirin; codeine; and sulfa antibiotics.  Allergy Precautions: None required Coagulopathies: Reviewed. None identified.  Blood-thinner therapy: None at this time Active Infection(s): Reviewed. None identified. Ebony Anderson is afebrile  Site Confirmation: Ebony Anderson was asked to confirm the procedure and laterality before marking the site Procedure checklist: Completed Consent: Before the procedure and under the influence of no sedative(s), amnesic(s), or anxiolytics, the patient was informed of the treatment options, risks and possible complications. To fulfill our ethical and legal obligations, as recommended by the American Medical Association's Code of Ethics, I have informed the patient of my clinical impression; the nature and purpose of the treatment or procedure; the risks, benefits, and possible complications of the intervention; the alternatives, including doing nothing; the risk(s) and benefit(s) of the alternative treatment(s) or procedure(s); and the risk(s) and benefit(s) of doing nothing. The patient was provided information about the general risks and possible complications associated with the procedure. These may include, but are not limited to: failure to achieve desired goals, infection, bleeding, organ or nerve damage, allergic reactions, paralysis, and death. In addition, the patient was informed of those risks and complications associated to Spine-related procedures, such as failure to decrease pain; infection (i.e.: Meningitis, epidural or intraspinal abscess); bleeding (i.e.: epidural hematoma, subarachnoid hemorrhage, or any other type of intraspinal or peri-dural bleeding); organ or nerve damage (i.e.: Any type of  peripheral nerve, nerve root, or spinal cord injury) with subsequent damage to sensory, motor, and/or autonomic systems, resulting in permanent pain, numbness, and/or weakness of  one or several areas of the body; allergic reactions; (i.e.: anaphylactic reaction); and/or death. Furthermore, the patient was informed of those risks and complications associated with the medications. These include, but are not limited to: allergic reactions (i.e.: anaphylactic or anaphylactoid reaction(s)); adrenal axis suppression; blood sugar elevation that in diabetics may result in ketoacidosis or comma; water retention that in patients with history of congestive heart failure may result in shortness of breath, pulmonary edema, and decompensation with resultant heart failure; weight gain; swelling or edema; medication-induced neural toxicity; particulate matter embolism and blood vessel occlusion with resultant organ, and/or nervous system infarction; and/or aseptic necrosis of one or more joints. Finally, the patient was informed that Medicine is not an exact science; therefore, there is also the possibility of unforeseen or unpredictable risks and/or possible complications that may result in a catastrophic outcome. The patient indicated having understood very clearly. We have given the patient no guarantees and we have made no promises. Enough time was given to the patient to ask questions, all of which were answered to the patient's satisfaction. Ebony Anderson has indicated that she wanted to continue with the procedure. Attestation: I, the ordering provider, attest that I have discussed with the patient the benefits, risks, side-effects, alternatives, likelihood of achieving goals, and potential problems during recovery for the procedure that I have provided informed consent. Date   Time: 09/26/2019 10:13 AM  Pre-Procedure Preparation:  Monitoring: As per clinic protocol. Respiration, ETCO2, SpO2, BP, heart rate and rhythm  monitor placed and checked for adequate function Safety Precautions: Patient was assessed for positional comfort and pressure points before starting the procedure. Time-out: I initiated and conducted the "Time-out" before starting the procedure, as per protocol. The patient was asked to participate by confirming the accuracy of the "Time Out" information. Verification of the correct person, site, and procedure were performed and confirmed by me, the nursing staff, and the patient. "Time-out" conducted as per Joint Commission's Universal Protocol (UP.01.01.01). Time: 1115  Description of Procedure:          Laterality: Bilateral. The procedure was performed in identical fashion on both sides. Levels: L3, L4, L5, Medial Branch Level(s) Area Prepped: Posterior Lumbosacral Region DuraPrep (Iodine Povacrylex [0.7% available iodine] and Isopropyl Alcohol, 74% w/w) Safety Precautions: Aspiration looking for blood return was conducted prior to all injections. At no point did we inject any substances, as a needle was being advanced. Before injecting, the patient was told to immediately notify me if she was experiencing any new onset of "ringing in the ears, or metallic taste in the mouth". No attempts were made at seeking any paresthesias. Safe injection practices and needle disposal techniques used. Medications properly checked for expiration dates. SDV (single dose vial) medications used. After the completion of the procedure, all disposable equipment used was discarded in the proper designated medical waste containers. Local Anesthesia: Protocol guidelines were followed. The patient was positioned over the fluoroscopy table. The area was prepped in the usual manner. The time-out was completed. The target area was identified using fluoroscopy. A 12-in long, straight, sterile hemostat was used with fluoroscopic guidance to locate the targets for each level blocked. Once located, the skin was marked with an  approved surgical skin marker. Once all sites were marked, the skin (epidermis, dermis, and hypodermis), as well as deeper tissues (fat, connective tissue and muscle) were infiltrated with a small amount of a short-acting local anesthetic, loaded on a 10cc syringe with a 25G, 1.5-in  Needle. An appropriate amount  of time was allowed for local anesthetics to take effect before proceeding to the next step. Local Anesthetic: Lidocaine 2.0% The unused portion of the local anesthetic was discarded in the proper designated containers. Technical explanation of process:   L3 Medial Branch Nerve Block (MBB): The target area for the L3 medial branch is at the junction of the postero-lateral aspect of the superior articular process and the superior, posterior, and medial edge of the transverse process of L4. Under fluoroscopic guidance, a Quincke needle was inserted until contact was made with os over the superior postero-lateral aspect of the pedicular shadow (target area). After negative aspiration for blood, 1.5 mL of the nerve block solution was injected without difficulty or complication. The needle was removed intact. L4 Medial Branch Nerve Block (MBB): The target area for the L4 medial branch is at the junction of the postero-lateral aspect of the superior articular process and the superior, posterior, and medial edge of the transverse process of L5. Under fluoroscopic guidance, a Quincke needle was inserted until contact was made with os over the superior postero-lateral aspect of the pedicular shadow (target area). After negative aspiration for blood,1.5 mL of the nerve block solution was injected without difficulty or complication. The needle was removed intact. L5 Medial Branch Nerve Block (MBB): The target area for the L5 medial branch is at the junction of the postero-lateral aspect of the superior articular process and the superior, posterior, and medial edge of the sacral ala. Under fluoroscopic guidance,  a Quincke needle was inserted until contact was made with os over the superior postero-lateral aspect of the pedicular shadow (target area). After negative aspiration for blood, 1.5 mL of the nerve block solution was injected without difficulty or complication. The needle was removed intact.  Nerve block solution: 10 cc solution made of 8 cc of 0.2% ropivacaine, 2 cc of Decadron 10 mg/cc.  1.5 cc injected at each level above bilaterally. The unused portion of the solution was discarded in the proper designated containers. Procedural Needles: 22-gauge, 3.5-inch, Quincke needles used for all levels.  Once the entire procedure was completed, the treated area was cleaned, making sure to leave some of the prepping solution back to take advantage of its long term bactericidal properties.   Illustration of the posterior view of the lumbar spine and the posterior neural structures. Laminae of L2 through S1 are labeled. DPRL5, dorsal primary ramus of L5; DPRS1, dorsal primary ramus of S1; DPR3, dorsal primary ramus of L3; FJ, facet (zygapophyseal) joint L3-L4; I, inferior articular process of L4; LB1, lateral branch of dorsal primary ramus of L1; IAB, inferior articular branches from L3 medial branch (supplies L4-L5 facet joint); IBP, intermediate branch plexus; MB3, medial branch of dorsal primary ramus of L3; NR3, third lumbar nerve root; S, superior articular process of L5; SAB, superior articular branches from L4 (supplies L4-5 facet joint also); TP3, transverse process of L3.  Vitals:   09/26/19 1115 09/26/19 1120 09/26/19 1125 09/26/19 1129  BP: (!) 157/81 (!) 161/79 (!) 157/90 (!) 163/74  Pulse:      Resp: 17 17 20  (!) 22  Temp:      SpO2: 97% 98% 98% 99%  Weight:      Height:         Start Time: 1115 hrs. End Time: 1130 hrs.  Imaging Guidance (Spinal):          Type of Imaging Technique: Fluoroscopy Guidance (Spinal) Indication(s): Assistance in needle guidance and placement for procedures  requiring  needle placement in or near specific anatomical locations not easily accessible without such assistance. Exposure Time: Please see nurses notes. Contrast: None used. Fluoroscopic Guidance: I was personally present during the use of fluoroscopy. "Tunnel Vision Technique" used to obtain the best possible view of the target area. Parallax error corrected before commencing the procedure. "Direction-depth-direction" technique used to introduce the needle under continuous pulsed fluoroscopy. Once target was reached, antero-posterior, oblique, and lateral fluoroscopic projection used confirm needle placement in all planes. Images permanently stored in EMR. Interpretation: No contrast injected. I personally interpreted the imaging intraoperatively. Adequate needle placement confirmed in multiple planes. Permanent images saved into the patient's record.  Antibiotic Prophylaxis:   Anti-infectives (From admission, onward)   None     Indication(s): None identified  Post-operative Assessment:  Post-procedure Vital Signs:  Pulse/HCG Rate: 8985 Temp: 97.6 F (36.4 C) Resp: (!) 22 BP: (!) 163/74 SpO2: 99 %  EBL: None  Complications: No immediate post-treatment complications observed by team, or reported by patient.  Note: The patient tolerated the entire procedure well. A repeat set of vitals were taken after the procedure and the patient was kept under observation following institutional policy, for this type of procedure. Post-procedural neurological assessment was performed, showing return to baseline, prior to discharge. The patient was provided with post-procedure discharge instructions, including a section on how to identify potential problems. Should any problems arise concerning this procedure, the patient was given instructions to immediately contact us, at any time, without hesitation. In any case, we plan to contact the patient by telephone for a follow-up status report regarding this  interventional procedure.  Comments:  No additional relevant information.  Plan of Care  Orders:  Orders Placed This Encounter  Procedures   DG PAIN CLINIC C-ARM 1-60 MIN NO REPORT    Intraoperative interpretation by procedural physician at [facility].    Standing Status:   Standing    Number of Occurrences:   1    Order Specific Question:   Reason for exam:    Answer:   Assistance in needle guidance and placement for procedures requiring needle placement in or near specific anatomical locations not easily accessible without such assistance.   Medications ordered for procedure: Meds ordered this encounter  Medications   lidocaine (XYLOCAINE) 2 % (with pres) injection 400 mg   ropivacaine (PF) 2 mg/mL (0.2%) (NAROPIN) injection 9 mL   dexamethasone (DECADRON) injection 10 mg   dexamethasone (DECADRON) injection 10 mg   Medications administered: We administered lidocaine, ropivacaine (PF) 2 mg/mL (0.2%), dexamethasone, and dexamethasone.  See the medical record for exact dosing, route, and time of administration.  Follow-up plan:   Return in about 4 weeks (around 10/24/2019) for Post Procedure Evaluation, in person.      Status post diagnostic L3, L4, L5 facet medial branch nerve block 09/26/2019   Recent Visits Date Type Provider Dept  09/12/19 Office Visit Edward JollyLateef, Lamario Mani, MD [facility]  Showing recent visits within past 90 days and meeting all other requirements   Today's Visits Date Type Provider Dept  09/26/19 Procedure visit Edward JollyLateef, Maddisen Vought, MD [facility]  Showing today's visits and meeting all other requirements   Future Appointments Date Type Provider Dept  10/31/19 Appointment Edward JollyLateef, Riyansh Gerstner, MD [facility]  Showing future appointments within next 90 days and meeting all other requirements   Disposition: Discharge home  Discharge (Date   Time): 09/26/2019; 1145 hrs.   Primary Care Physician: Leanna SatoMiles, Linda M, MD Location:  [facility] Note by: Edward Jolly, MD Date: 09/26/2019; Time: 11:46 AM  Disclaimer:  Medicine is not an exact science. The only guarantee in medicine is that nothing is guaranteed. It is important to note that the decision to proceed with this intervention was based on the information collected from the patient. The Data and conclusions were drawn from the patient's questionnaire, the interview, and the physical examination. Because the information was provided in large part by the patient, it cannot be guaranteed that it has not been purposely or unconsciously manipulated. Every effort has been made to obtain as much relevant data as possible for this evaluation. It is important to note that the conclusions that lead to this procedure are derived in large part from the available data. Always take into account that the treatment will also be dependent on availability of resources and existing treatment guidelines, considered by other Pain Management Practitioners as being common knowledge and practice, at the time of the intervention. For Medico-Legal purposes, it is also important to point out that variation in procedural techniques and pharmacological choices are the acceptable norm. The indications, contraindications, technique, and results of the above procedure should only be interpreted and judged by a Board-Certified Interventional Pain Specialist with extensive familiarity and expertise in the same exact procedure and technique.

## 2019-09-26 NOTE — Progress Notes (Signed)
Safety precautions to be maintained throughout the outpatient stay will include: orient to surroundings, keep bed in low position, maintain call bell within reach at all times, provide assistance with transfer out of bed and ambulation.  

## 2019-09-26 NOTE — Patient Instructions (Signed)
Pain Management Discharge Instructions  General Discharge Instructions :  If you need to reach your doctor call: Monday-Friday 8:00 am - 4:00 pm at [phone] or toll free [phone].  After clinic hours [phone] to have operator reach doctor.  Bring all of your medication bottles to all your appointments in the pain clinic.  To cancel or reschedule your appointment with Pain Management please remember to call 24 hours in advance to avoid a fee.  Refer to the educational materials which you have been given on: General Risks, I had my Procedure. Discharge Instructions, Post Sedation.  Post Procedure Instructions:  The drugs you were given will stay in your system until tomorrow, so for the next 24 hours you should not drive, make any legal decisions or drink any alcoholic beverages.  You may eat anything you prefer, but it is better to start with liquids then soups and crackers, and gradually work up to solid foods.  Please notify your doctor immediately if you have any unusual bleeding, trouble breathing or pain that is not related to your normal pain.  Depending on the type of procedure that was done, some parts of your body may feel week and/or numb.  This usually clears up by tonight or the next day.  Walk with the use of an assistive device or accompanied by an adult for the 24 hours.  You may use ice on the affected area for the first 24 hours.  Put ice in a Ziploc bag and cover with a towel and place against area 15 minutes on 15 minutes off.  You may switch to heat after 24 hours.Facet Blocks Patient Information  Description: The facets are joints in the spine between the vertebrae.  Like any joints in the body, facets can become irritated and painful.  Arthritis can also effect the facets.  By injecting steroids and local anesthetic in and around these joints, we can temporarily block the nerve supply to them.  Steroids act directly on irritated nerves and tissues to  reduce selling and inflammation which often leads to decreased pain.  Facet blocks may be done anywhere along the spine from the neck to the low back depending upon the location of your pain.   After numbing the skin with local anesthetic (like Novocaine), a small needle is passed onto the facet joints under x-ray guidance.  You may experience a sensation of pressure while this is being done.  The entire block usually lasts about 15-25 minutes.   Conditions which may be treated by facet blocks:   Low back/buttock pain  Neck/shoulder pain  Certain types of headaches  Preparation for the injection:  1. Do not eat any solid food or dairy products within 8 hours of your appointment. 2. You may drink clear liquid up to 3 hours before appointment.  Clear liquids include water, black coffee, juice or soda.  No milk or cream please. 3. You may take your regular medication, including pain medications, with a sip of water before your appointment.  Diabetics should hold regular insulin (if taken separately) and take 1/2 normal NPH dose the morning of the procedure.  Carry some sugar containing items with you to your appointment. 4. A driver must accompany you and be prepared to drive you home after your procedure. 5. Bring all your current medications with you. 6. An IV may be inserted and sedation may be given at the discretion of the physician. 7. A blood pressure cuff, EKG and other monitors will often be   applied during the procedure.  Some patients may need to have extra oxygen administered for a short period. 8. You will be asked to provide medical information, including your allergies and medications, prior to the procedure.  We must know immediately if you are taking blood thinners (like Coumadin/Warfarin) or if you are allergic to IV iodine contrast (dye).  We must know if you could possible be pregnant.  Possible side-effects:   Bleeding from needle site  Infection (rare, may require  surgery)  Nerve injury (rare)  Numbness & tingling (temporary)  Difficulty urinating (rare, temporary)  Spinal headache (a headache worse with upright posture)  Light-headedness (temporary)  Pain at injection site (serveral days)  Decreased blood pressure (rare, temporary)  Weakness in arm/leg (temporary)  Pressure sensation in back/neck (temporary)   Call if you experience:   Fever/chills associated with headache or increased back/neck pain  Headache worsened by an upright position  New onset, weakness or numbness of an extremity below the injection site  Hives or difficulty breathing (go to the emergency room)  Inflammation or drainage at the injection site(s)  Severe back/neck pain greater than usual  New symptoms which are concerning to you  Please note:  Although the local anesthetic injected can often make your back or neck feel good for several hours after the injection, the pain will likely return. It takes 3-7 days for steroids to work.  You may not notice any pain relief for at least one week.  If effective, we will often do a series of 2-3 injections spaced 3-6 weeks apart to maximally decrease your pain.  After the initial series, you may be a candidate for a more permanent nerve block of the facets.  If you have any questions, please call [phone] [facility] 

## 2019-09-27 ENCOUNTER — Telehealth: Payer: Self-pay | Admitting: *Deleted

## 2019-09-27 NOTE — Telephone Encounter (Signed)
No problems post procedure. 

## 2019-10-11 ENCOUNTER — Telehealth: Payer: Self-pay | Admitting: Student in an Organized Health Care Education/Training Program

## 2019-10-11 NOTE — Telephone Encounter (Signed)
Patient lvmail at 1:25 stating she is having shoulder pain for 2 days and would like to speak with someone about what she can do to aleve this pain, heat or cold.

## 2019-10-13 ENCOUNTER — Telehealth: Payer: Self-pay | Admitting: *Deleted

## 2019-10-13 NOTE — Telephone Encounter (Signed)
Spoke with patient re; her c/o right shoulder pain.  She states that at this point this is resolved, she used Tramadol and heat to the affected area.  She states that she doesn't understand why she never received a phone call post procedure from 09/26/19.  I did tell her that it was charted by Midge Minium RN that there were no problem or concerns from procedure on 09/27/19@ 0931.  Patient denies receiving phone call.  Also, she is concerned as to why Dr Cherylann Ratel ordered 10 x-rays?  I told her that I could see where 3 x-rays were ordered.  She states she never received any info on these and felt that it was a lot of radiation at one time.  I asked patient if she would like to move her appt up from 10/31/19 so that she could see Dr Cherylann Ratel and discuss these issues but she is happy to keep her appt on 10/31/19.  She would however, like for me to convey this message to Dr Cherylann Ratel and I told her I would.

## 2019-10-31 ENCOUNTER — Other Ambulatory Visit: Payer: Self-pay

## 2019-10-31 ENCOUNTER — Encounter: Payer: Self-pay | Admitting: Student in an Organized Health Care Education/Training Program

## 2019-10-31 ENCOUNTER — Ambulatory Visit
Payer: Medicare PPO | Attending: Student in an Organized Health Care Education/Training Program | Admitting: Student in an Organized Health Care Education/Training Program

## 2019-10-31 VITALS — BP 105/62 | HR 75 | Temp 96.9°F | Resp 16 | Ht 64.0 in | Wt 146.0 lb

## 2019-10-31 DIAGNOSIS — M19011 Primary osteoarthritis, right shoulder: Secondary | ICD-10-CM | POA: Diagnosis present

## 2019-10-31 DIAGNOSIS — G894 Chronic pain syndrome: Secondary | ICD-10-CM

## 2019-10-31 DIAGNOSIS — M5412 Radiculopathy, cervical region: Secondary | ICD-10-CM | POA: Insufficient documentation

## 2019-10-31 DIAGNOSIS — M47816 Spondylosis without myelopathy or radiculopathy, lumbar region: Secondary | ICD-10-CM | POA: Diagnosis not present

## 2019-10-31 DIAGNOSIS — M47812 Spondylosis without myelopathy or radiculopathy, cervical region: Secondary | ICD-10-CM | POA: Insufficient documentation

## 2019-10-31 DIAGNOSIS — M5136 Other intervertebral disc degeneration, lumbar region: Secondary | ICD-10-CM | POA: Diagnosis not present

## 2019-10-31 DIAGNOSIS — M19012 Primary osteoarthritis, left shoulder: Secondary | ICD-10-CM | POA: Insufficient documentation

## 2019-10-31 MED ORDER — GABAPENTIN 300 MG PO CAPS: 300.0000 mg | ORAL_CAPSULE | Freq: Every day | ORAL | 1 refills | Status: DC

## 2019-10-31 MED ORDER — TRAMADOL HCL 50 MG PO TABS: 50.0000 mg | ORAL_TABLET | Freq: Every day | ORAL | 0 refills | Status: DC | PRN

## 2019-10-31 NOTE — Patient Instructions (Signed)
1.  Today we discussed [employer] peripheral nerve stimulation.  We provided you with a brochure. 2.  Start gabapentin 300 mg at night 3.  Refill tramadol 4.  Follow-up in 6 weeks

## 2019-10-31 NOTE — Progress Notes (Signed)
PROVIDER NOTE: Information contained herein reflects review and annotations entered in association with encounter. Interpretation of such information and data should be left to medically-trained personnel. Information provided to patient can be located elsewhere in the medical record under "Patient Instructions". Document created using STT-dictation technology, any transcriptional errors that may result from process are unintentional.    Patient: Ebony Anderson  Service Category: E/M  Provider: Gillis Santa, MD  DOB: January 06, 1933  DOS: 10/31/2019  Specialty: Interventional Pain Management  MRN: [id number]  Setting: Ambulatory outpatient  PCP: Marguerita Merles, MD  Type: Established Patient    Referring Provider: Marguerita Merles, MD  Location: Office  Delivery: Face-to-face     HPI  Reason for encounter: Ms. Ebony Anderson, a 86 y.o. year old female, is here today for evaluation and management of her Lumbar facet arthropathy [M47.816]. Ms. Ebony Anderson's primary complain today is Back Pain (lumbar  bilateral ) and Shoulder Pain (right ) Last encounter: Practice (10/13/2019). My last encounter with her was on 10/11/2019. Pertinent problems: Ms. Ebony Anderson has Lumbar facet arthropathy; Lumbar degenerative disc disease; Chronic pain syndrome; Cervical radicular pain; Pain of both shoulder joints; Localized osteoarthritis of shoulder regions, bilateral; and Cervical facet joint syndrome on their pertinent problem list. Pain Assessment: Severity of Chronic pain is reported as a 0-No pain/10. Location: Back (shoulder) Lower, Left, Right (right)/denies. Onset: More than a month ago. Quality: Sharp, Radiating. Timing: Intermittent. Modifying factor(s): rest. Vitals:  height is '5\' 4"'  (1.626 m) and weight is 146 lb (66.2 kg). Her temporal temperature is 96.9 F (36.1 C) (abnormal). Her blood pressure is 105/62 and her pulse is 75. Her respiration is 16 and oxygen saturation is 99%.   Patient follows up today for medication management  and postprocedural evaluation.  She has also completed her shoulder and cervical spine x-rays.  She states that she did not start gabapentin due to side effects that she read on the prescription bottle.  She also had no significant benefit after her diagnostic lumbar facet medial branch nerve blocks that were done on 09/26/2019.  She continues to endorse persistent and significant axial low back pain.  She states that she does not engage in much physical activity although she once used to in the past.  She understands that she needs to be more active and hopes to work towards that.  Post-Procedure Evaluation  Procedure:   Type: Lumbar Facet, Medial Branch Block(s) #1  Primary Purpose: Diagnostic Region: Posterolateral Lumbosacral Spine Level: L3, L4, L5,  Medial Branch Level(s). Injecting these levels blocks the L3-4, L4-5, lumbar facet joints. Laterality: Bilateral  Type: Local Anesthesia   Local Anesthetic: Lidocaine 1-2%  Position: Prone   Indications: 1. Lumbar facet arthropathy    Pain Score: Pre-procedure: 3 /10 Post-procedure: 3 /10   Sedation: Please see nurses note.  Effectiveness during initial hour after procedure(Ultra-Short Term Relief): 0 %  Local anesthetic used: Long-acting (4-6 hours) Effectiveness: Defined as any analgesic benefit obtained secondary to the administration of local anesthetics. This carries significant diagnostic value as to the etiological location, or anatomical origin, of the pain. Duration of benefit is expected to coincide with the duration of the local anesthetic used.  Effectiveness during initial 4-6 hours after procedure(Short-Term Relief): 0 %   Long-term benefit: Defined as any relief past the pharmacologic duration of the local anesthetics.  Effectiveness past the initial 6 hours after procedure(Long-Term Relief): 0 %   Current benefits: Defined as benefit that persist at this time.   Analgesia:  No benefit Function: No benefit ROM:  No benefit   ROS  Constitutional: Denies any fever or chills Gastrointestinal: No reported hemesis, hematochezia, vomiting, or acute GI distress Musculoskeletal: Denies any acute onset joint swelling, redness, loss of ROM, or weakness Neurological: No reported episodes of acute onset apraxia, aphasia, dysarthria, agnosia, amnesia, paralysis, loss of coordination, or loss of consciousness  Medication Review  cholecalciferol, diazepam, esomeprazole, gabapentin, ketoconazole, levothyroxine, lisinopril, magnesium oxide, mometasone, pravastatin, and traMADol  History Review  Allergy: Ms. Ebony Anderson is allergic to aspirin, codeine, and sulfa antibiotics. Drug: Ms. Ebony Anderson  reports no history of drug use. Alcohol:  reports no history of alcohol use. Tobacco:  reports that she has never smoked. She has never used smokeless tobacco. Social: Ms. Ebony Anderson  reports that she has never smoked. She has never used smokeless tobacco. She reports that she does not drink alcohol and does not use drugs. Medical:  has a past medical history of Hyperlipidemia, Hypertension, and Thyroid disease. Surgical: Ms. Ebony Anderson  has a past surgical history that includes Thyroidectomy. Family: family history is not on file.  Laboratory Chemistry Profile   Renal No results found for: BUN, CREATININE, LABCREA, BCR, GFR, GFRAA, GFRNONAA, LABVMA, EPIRU, EPINEPH24HUR, NOREPRU, NOREPI24HUR, DOPARU, [id number]   Hepatic No results found for: AST, ALT, ALBUMIN, ALKPHOS, HCVAB, AMYLASE, LIPASE, AMMONIA   Electrolytes No results found for: NA, K, CL, CALCIUM, MG, PHOS   Bone No results found for: VD25OH, [id number], [id number], [id number], 25OHVITD1, 25OHVITD2, 25OHVITD3, TESTOFREE, TESTOSTERONE   Inflammation (CRP: Acute Phase) (ESR: Chronic Phase) No results found for: CRP, ESRSEDRATE, LATICACIDVEN     Note: Above Lab results reviewed.  Recent Imaging Review  DG PAIN CLINIC C-ARM 1-60 MIN NO REPORT Fluoro was used, but no  Radiologist interpretation will be provided.  Please refer to "NOTES" tab for provider progress note. Note: Reviewed        Physical Exam  General appearance: Well nourished, well developed, and well hydrated. In no apparent acute distress Mental status: Alert, oriented x 3 (person, place, & time)       Respiratory: No evidence of acute respiratory distress Eyes: PERLA Vitals: BP 105/62 (BP Location: Right Arm, Patient Position: Sitting, Cuff Size: Normal)   Pulse 75   Temp (!) 96.9 F (36.1 C) (Temporal)   Resp 16   Ht '5\' 4"'  (1.626 m)   Wt 146 lb (66.2 kg)   SpO2 99%   BMI 25.06 kg/m  BMI: Estimated body mass index is 25.06 kg/m as calculated from the following:   Height as of this encounter: '5\' 4"'  (1.626 m).   Weight as of this encounter: 146 lb (66.2 kg). Ideal: Ideal body weight: 54.7 kg (120 lb 9.5 oz) Adjusted ideal body weight: 59.3 kg (130 lb 12.1 oz)  Cervical Spine Area Exam  Skin & Axial Inspection: No masses, redness, edema, swelling, or associated skin lesions Alignment: Symmetrical Functional ROM: Pain restricted ROM, bilaterally Stability: No instability detected Muscle Tone/Strength: Functionally intact. No obvious neuro-muscular anomalies detected. Sensory (Neurological): Musculoskeletal pain pattern  Upper Extremity (UE) Exam    Side: Right upper extremity  Side: Left upper extremity  Skin & Extremity Inspection: Skin color, temperature, and hair growth are WNL. No peripheral edema or cyanosis. No masses, redness, swelling, asymmetry, or associated skin lesions. No contractures.  Skin & Extremity Inspection: Skin color, temperature, and hair growth are WNL. No peripheral edema or cyanosis. No masses, redness, swelling, asymmetry, or associated skin lesions. No contractures.  Functional ROM: Pain restricted  ROM for shoulder  Functional ROM: Pain restricted ROM for shoulder  Muscle Tone/Strength: Functionally intact. No obvious neuro-muscular anomalies detected.   Muscle Tone/Strength: Functionally intact. No obvious neuro-muscular anomalies detected.  Sensory (Neurological): Arthropathic arthralgia          Sensory (Neurological): Arthropathic arthralgia          Palpation: No palpable anomalies              Palpation: No palpable anomalies              Provocative Test(s):  Phalen's test: deferred Tinel's test: deferred Apley's scratch test (touch opposite shoulder):  Action 1 (Across chest): Decreased ROM Action 2 (Overhead): Decreased ROM Action 3 (LB reach): Decreased ROM   Provocative Test(s):  Phalen's test: deferred Tinel's test: deferred Apley's scratch test (touch opposite shoulder):  Action 1 (Across chest): Decreased ROM Action 2 (Overhead): Decreased ROM Action 3 (LB reach): Decreased ROM    Lumbar Spine Area Exam  Skin & Axial Inspection: Lumbar Scoliosis Alignment: Symmetrical Functional ROM: Pain restricted ROM affecting both sides Stability: No instability detected Muscle Tone/Strength: Functionally intact. No obvious neuro-muscular anomalies detected. Sensory (Neurological): Musculoskeletal pain pattern Palpation: No palpable anomalies       Provocative Tests: Hyperextension/rotation test: (+) bilaterally for facet joint pain.  Lower Extremity Exam    Side: Right lower extremity  Side: Left lower extremity  Stability: No instability observed          Stability: No instability observed          Skin & Extremity Inspection: Skin color, temperature, and hair growth are WNL. No peripheral edema or cyanosis. No masses, redness, swelling, asymmetry, or associated skin lesions. No contractures.  Skin & Extremity Inspection: Skin color, temperature, and hair growth are WNL. No peripheral edema or cyanosis. No masses, redness, swelling, asymmetry, or associated skin lesions. No contractures.  Functional ROM: Unrestricted ROM                  Functional ROM: Unrestricted ROM                  Muscle Tone/Strength: Functionally  intact. No obvious neuro-muscular anomalies detected.  Muscle Tone/Strength: Functionally intact. No obvious neuro-muscular anomalies detected.  Sensory (Neurological): Unimpaired        Sensory (Neurological): Unimpaired        DTR: Patellar: deferred today Achilles: deferred today Plantar: deferred today  DTR: Patellar: deferred today Achilles: deferred today Plantar: deferred today  Palpation: No palpable anomalies  Palpation: No palpable anomalies    Assessment   Status Diagnosis  Persistent Persistent Persistent 1. Lumbar facet arthropathy   2. Lumbar spondylosis   3. Lumbar degenerative disc disease   4. Cervical facet joint syndrome   5. Cervical radicular pain   6. Chronic pain syndrome   7. Localized osteoarthritis of shoulder regions, bilateral      Updated Problems: Problem  Localized Osteoarthritis of Shoulder Regions, Bilateral  Cervical Facet Joint Syndrome  Lumbar Facet Arthropathy  Lumbar Degenerative Disc Disease  Chronic Pain Syndrome  Cervical Radicular Pain  Pain of Both Shoulder Joints    Plan of Care   Ms. Alaisha Eversley has a current medication list which includes the following long-term medication(s): esomeprazole, lisinopril, pravastatin, and gabapentin.  1.  Lumbar facet arthropathy/lumbar spondylosis: Clinical, physical exam and radiographic findings indicative of severe lumbar degeneration and facet disease.  Status post bilateral L3, L4, L5 facet medial branch nerve block on 09/26/2019 which  unfortunately was not effective.  We discussed alternative therapies including Sprint peripheral nerve stimulation of lumbar medial branch nerves.  This was discussed in detail with the patient.  She was provided resources to review.  Can consider in future.  2.  Discussed gabapentin and reviewed potential side effects.  Patient would like to give this a trial, recommend she start at 300 mg nightly.  3.  Refill tramadol as below.  Pharmacotherapy  (Medications Ordered): Meds ordered this encounter  Medications  . gabapentin (NEURONTIN) 300 MG capsule    Sig: Take 1 capsule (300 mg total) by mouth at bedtime.    Dispense:  30 capsule    Refill:  1  . traMADol (ULTRAM) 50 MG tablet    Sig: Take 1 tablet (50 mg total) by mouth daily as needed for severe pain. Month last 30 days.    Dispense:  30 tablet    Refill:  0    [place] STOP ACT - Not applicable. Fill one day early if pharmacy is closed on scheduled refill date.   Follow-up plan:   Return in about 6 weeks (around 12/12/2019) for Medication Management, in person.     Status post diagnostic L3, L4, L5 facet medial branch nerve block 09/26/2019: Not effective, consider sprint peripheral nerve stimulation of medial branch patient provided resources.    Recent Visits Date Type Provider Dept  09/26/19 Procedure visit Gillis Santa, MD [facility]  09/12/19 Office Visit Gillis Santa, MD [facility]  Showing recent visits within past 90 days and meeting all other requirements Today's Visits Date Type Provider Dept  10/31/19 Office Visit Gillis Santa, MD [facility]  Showing today's visits and meeting all other requirements Future Appointments Date Type Provider Dept  12/08/19 Appointment Gillis Santa, MD [facility]  Showing future appointments within next 90 days and meeting all other requirements  I discussed the assessment and treatment plan with the patient. The patient was provided an opportunity to ask questions and all were answered. The patient agreed with the plan and demonstrated an understanding of the instructions.  Patient advised to call back or seek an in-person evaluation if the symptoms or condition worsens.  Duration of encounter: 30 minutes.  Note by: Gillis Santa, MD Date: 10/31/2019; Time: 2:35 PM

## 2019-10-31 NOTE — Progress Notes (Signed)
Safety precautions to be maintained throughout the outpatient stay will include: orient to surroundings, keep bed in low position, maintain call bell within reach at all times, provide assistance with transfer out of bed and ambulation.  

## 2019-11-02 ENCOUNTER — Telehealth: Payer: Self-pay

## 2019-11-02 DIAGNOSIS — G894 Chronic pain syndrome: Secondary | ICD-10-CM

## 2019-11-02 MED ORDER — TRAMADOL HCL 50 MG PO TABS: 50.0000 mg | ORAL_TABLET | Freq: Every day | ORAL | 0 refills | Status: AC | PRN

## 2019-11-02 MED ORDER — GABAPENTIN 300 MG PO CAPS: 300.0000 mg | ORAL_CAPSULE | Freq: Every day | ORAL | 1 refills | Status: DC

## 2019-11-02 NOTE — Telephone Encounter (Signed)
She said her medicines have not been called in to [facility] on [address] yet. Did you send them to the right one?  Their number is [phone]

## 2019-11-02 NOTE — Telephone Encounter (Signed)
Mrs Purington called and her medications have been sent to the incorrect pharmacy.  She wanted them to go to [facility] in [place] instead of mail order pharmacy.  I told her husband that I would enter the correct pharmacy and see if Dr Cherylann Ratel would resend.

## 2019-11-03 ENCOUNTER — Telehealth: Payer: Self-pay | Admitting: *Deleted

## 2019-11-03 NOTE — Telephone Encounter (Signed)
Spoke with with pharmacy and they report that they can't fill the tramadol now that it is too soon.  Patient's husband reported that last fill date was 09/18/19 however, when I reviewed PMP there was a tramadol 50 mg qty 30 filled on 10/31/19 that was not reported by patient.  This would be the reason for the pharmacy not filling yet.

## 2019-11-03 NOTE — Telephone Encounter (Signed)
Called patient to let her know that [facility] has filled the Tramadol on 11/01/19.  Patient's husband stated that he had received a text about an Rx that was coming.  It is supposed to be coming on November 03, 2019.  They will wait for that and then fill the existing Rx when appropriate.

## 2019-11-03 NOTE — Telephone Encounter (Signed)
Mrs. Tapley called again, stating that [facility] told her she could not fill the scripts sent in because it was too soon. Please call the pharmacy and find out what the problem is so she can get her medications.

## 2019-11-03 NOTE — Telephone Encounter (Signed)
Called patient to find out what is going on.  Spoke with husband, they have not talked to pharmacy but received message that it is too soon to fill the tramadol.  Pharmacy states there was a fill [facility]/19 at another [facility], unsure of location.  I will check PMP for fill dates.

## 2019-11-11 ENCOUNTER — Ambulatory Visit: Payer: Medicare PPO | Admitting: Cardiology

## 2019-12-08 ENCOUNTER — Ambulatory Visit
Payer: Medicare PPO | Attending: Student in an Organized Health Care Education/Training Program | Admitting: Student in an Organized Health Care Education/Training Program

## 2019-12-08 ENCOUNTER — Other Ambulatory Visit: Payer: Self-pay

## 2019-12-08 ENCOUNTER — Encounter: Payer: Self-pay | Admitting: Student in an Organized Health Care Education/Training Program

## 2019-12-08 VITALS — BP 118/65 | HR 68 | Temp 97.0°F | Resp 16 | Ht 64.0 in | Wt 145.0 lb

## 2019-12-08 DIAGNOSIS — G894 Chronic pain syndrome: Secondary | ICD-10-CM | POA: Diagnosis not present

## 2019-12-08 DIAGNOSIS — M542 Cervicalgia: Secondary | ICD-10-CM | POA: Diagnosis present

## 2019-12-08 DIAGNOSIS — M19011 Primary osteoarthritis, right shoulder: Secondary | ICD-10-CM | POA: Diagnosis present

## 2019-12-08 DIAGNOSIS — M5136 Other intervertebral disc degeneration, lumbar region: Secondary | ICD-10-CM | POA: Insufficient documentation

## 2019-12-08 DIAGNOSIS — M47812 Spondylosis without myelopathy or radiculopathy, cervical region: Secondary | ICD-10-CM | POA: Insufficient documentation

## 2019-12-08 DIAGNOSIS — M47816 Spondylosis without myelopathy or radiculopathy, lumbar region: Secondary | ICD-10-CM | POA: Diagnosis not present

## 2019-12-08 DIAGNOSIS — M19012 Primary osteoarthritis, left shoulder: Secondary | ICD-10-CM | POA: Diagnosis present

## 2019-12-08 DIAGNOSIS — M5412 Radiculopathy, cervical region: Secondary | ICD-10-CM | POA: Diagnosis present

## 2019-12-08 MED ORDER — TRAMADOL HCL 50 MG PO TABS: 50.0000 mg | ORAL_TABLET | Freq: Every day | ORAL | 1 refills | Status: DC | PRN

## 2019-12-08 MED ORDER — GABAPENTIN 300 MG PO CAPS: 300.0000 mg | ORAL_CAPSULE | Freq: Every day | ORAL | 1 refills | Status: DC

## 2019-12-08 NOTE — Progress Notes (Addendum)
PROVIDER NOTE: Information contained herein reflects review and annotations entered in association with encounter. Interpretation of such information and data should be left to medically-trained personnel. Information provided to patient can be located elsewhere in the medical record under "Patient Instructions". Document created using STT-dictation technology, any transcriptional errors that may result from process are unintentional.    Patient: Ebony Anderson  Service Category: E/M  Provider: Gillis Santa, MD  DOB: 10/16/1932  DOS: 12/08/2019  Specialty: Interventional Pain Management  MRN: [id number]  Setting: Ambulatory outpatient  PCP: Marguerita Merles, MD  Type: Established Patient    Referring Provider: Marguerita Merles, MD  Location: Office  Delivery: Face-to-face     HPI  Reason for encounter: Ebony Anderson, a 86 y.o. year old female, is here today for evaluation and management of her Chronic pain syndrome [G89.4]. Ebony Anderson primary complain today is Back Pain (lower) and Shoulder Pain (right) Last encounter: Practice (11/03/2019). My last encounter with her was on 10/31/2019. Pertinent problems: Ebony Anderson has Lumbar facet arthropathy; Lumbar degenerative disc disease; Chronic pain syndrome; Cervical radicular pain; Pain of both shoulder joints; Localized osteoarthritis of shoulder regions, bilateral; and Cervical facet joint syndrome on their pertinent problem list. Pain Assessment: Severity of Chronic pain is reported as a 9 /10. Location: Back Lower/denies. Onset: More than a month ago. Quality: Crushing. Timing: Intermittent. Modifying factor(s): rest. Vitals:  height is '5\' 4"'  (1.626 m) and weight is 145 lb (65.8 kg). Her temporal temperature is 97 F (36.1 C) (abnormal). Her blood pressure is 118/65 and her pulse is 68. Her respiration is 16 and oxygen saturation is 95%.   No change in medical history since last visit.  Patient's pain is at baseline.  Patient continues multimodal pain  regimen as prescribed.  States that it provides pain relief and improvement in functional status. No side effects with gabapentin although patient has not taken this medication regularly as prescribed which I encouraged her to do to optimize her pain relief. Takes tramadol as needed especially when she is leaving the house to go to the grocery store.  Pharmacotherapy Assessment   Analgesic: Tramadol 50 mg daily as needed, quantity 30/month  Monitoring  [place] PMP: PDMP reviewed during this encounter.       Pharmacotherapy: No side-effects or adverse reactions reported. Compliance: No problems identified. Effectiveness: Clinically acceptable.  Landis Martins, RN  12/08/2019 10:51 AM  Sign when Signing Visit Safety precautions to be maintained throughout the outpatient stay will include: orient to surroundings, keep bed in low position, maintain call bell within reach at all times, provide assistance with transfer out of bed and ambulation.     UDS:  Summary  Date Value Ref Range Status  09/13/2019 Note  Final    Comment:    ==================================================================== Compliance Drug Analysis, Ur ==================================================================== Test                             Result       Flag       Units Drug Present and Declared for Prescription Verification   Desmethyldiazepam              628          EXPECTED   ng/mg creat   Oxazepam                       >1235  EXPECTED   ng/mg creat   Temazepam                      1078         EXPECTED   ng/mg creat    Desmethyldiazepam, oxazepam, and temazepam are expected metabolites    of diazepam. Desmethyldiazepam and oxazepam are also expected    metabolites of other drugs, including chlordiazepoxide, prazepam,    clorazepate, and halazepam. Oxazepam is an expected metabolite of    temazepam. Oxazepam and temazepam are also available as scheduled    prescription medications. Drug  Present not Declared for Prescription Verification   Acetaminophen                  PRESENT      UNEXPECTED Drug Absent but Declared for Prescription Verification   Tramadol                       Not Detected UNEXPECTED ng/mg creat   Gabapentin                     Not Detected UNEXPECTED ==================================================================== Test                      Result    Flag   Units      Ref Range   Creatinine              162              mg/dL      >=20 ==================================================================== Declared Medications:  The flagging and interpretation on this report are based on the  following declared medications.  Unexpected results may arise from  inaccuracies in the declared medications.  **Note: The testing scope of this panel includes these medications:  Diazepam (Valium)  Gabapentin (Neurontin)  Tramadol (Ultram)  **Note: The testing scope of this panel does not include the  following reported medications:  Esomeprazole (Nexium)  Levothyroxine (Synthroid)  Lisinopril (Zestril)  Magnesium (Mag-Ox)  Pravastatin (Pravachol)  Topical  Vitamin D3 ==================================================================== For clinical consultation, please call [phone]. ====================================================================      ROS  Constitutional: Denies any fever or chills Gastrointestinal: No reported hemesis, hematochezia, vomiting, or acute GI distress Musculoskeletal: Denies any acute onset joint swelling, redness, loss of ROM, or weakness Neurological: No reported episodes of acute onset apraxia, aphasia, dysarthria, agnosia, amnesia, paralysis, loss of coordination, or loss of consciousness  Medication Review  cholecalciferol, diazepam, esomeprazole, gabapentin, ketoconazole, levothyroxine, lisinopril, magnesium oxide, mometasone, pravastatin, and traMADol  History Review  Allergy: Ebony Anderson is allergic  to aspirin, codeine, and sulfa antibiotics. Drug: Ebony Anderson  reports no history of drug use. Alcohol:  reports no history of alcohol use. Tobacco:  reports that she has never smoked. She has never used smokeless tobacco. Social: Ebony Anderson  reports that she has never smoked. She has never used smokeless tobacco. She reports that she does not drink alcohol and does not use drugs. Medical:  has a past medical history of Hyperlipidemia, Hypertension, and Thyroid disease. Surgical: Ebony Anderson  has a past surgical history that includes Thyroidectomy. Family: family history is not on file.  Laboratory Chemistry Profile   Renal No results found for: BUN, CREATININE, LABCREA, BCR, GFR, GFRAA, GFRNONAA, LABVMA, EPIRU, EPINEPH24HUR, NOREPRU, NOREPI24HUR, DOPARU, [id number]   Hepatic No results found for: AST, ALT, ALBUMIN, ALKPHOS, HCVAB, AMYLASE, LIPASE, AMMONIA   Electrolytes No results found  for: NA, K, CL, CALCIUM, MG, PHOS   Bone No results found for: VD25OH, [id number], AU6333LK5, GY5638LH7, 25OHVITD1, 25OHVITD2, 25OHVITD3, TESTOFREE, TESTOSTERONE   Inflammation (CRP: Acute Phase) (ESR: Chronic Phase) No results found for: CRP, ESRSEDRATE, LATICACIDVEN     Note: Above Lab results reviewed.   Physical Exam  General appearance: Well nourished, well developed, and well hydrated. In no apparent acute distress Mental status: Alert, oriented x 3 (person, place, & time)       Respiratory: No evidence of acute respiratory distress Eyes: PERLA Vitals: BP 118/65   Pulse 68   Temp (!) 97 F (36.1 C) (Temporal)   Resp 16   Ht '5\' 4"'  (1.626 m)   Wt 145 lb (65.8 kg)   SpO2 95%   BMI 24.89 kg/m  BMI: Estimated body mass index is 24.89 kg/m as calculated from the following:   Height as of this encounter: '5\' 4"'  (1.626 m).   Weight as of this encounter: 145 lb (65.8 kg). Ideal: Ideal body weight: 54.7 kg (120 lb 9.5 oz) Adjusted ideal body weight: 59.1 kg (130 lb 5.7 oz)   Lumbar  Spine Area Exam  Skin & Axial Inspection: No masses, redness, or swelling Alignment: Levoscoliosis Functional ROM: Pain restricted ROM       Stability: No instability detected Muscle Tone/Strength: Functionally intact. No obvious neuro-muscular anomalies detected. Sensory (Neurological): Musculoskeletal pain pattern  Gait & Posture Assessment  Ambulation: Limited Gait: Antalgic Posture: Difficulty standing up straight, due to pain  Lower Extremity Exam    Side: Right lower extremity  Side: Left lower extremity  Stability: No instability observed          Stability: No instability observed          Skin & Extremity Inspection: Skin color, temperature, and hair growth are WNL. No peripheral edema or cyanosis. No masses, redness, swelling, asymmetry, or associated skin lesions. No contractures.  Skin & Extremity Inspection: Skin color, temperature, and hair growth are WNL. No peripheral edema or cyanosis. No masses, redness, swelling, asymmetry, or associated skin lesions. No contractures.  Functional ROM: Pain restricted ROM for hip joint          Functional ROM: Pain restricted ROM for hip joint          Muscle Tone/Strength: Functionally intact. No obvious neuro-muscular anomalies detected.  Muscle Tone/Strength: Functionally intact. No obvious neuro-muscular anomalies detected.  Sensory (Neurological): Unimpaired        Sensory (Neurological): Unimpaired        DTR: Patellar: deferred today Achilles: deferred today Plantar: deferred today  DTR: Patellar: deferred today Achilles: deferred today Plantar: deferred today  Palpation: No palpable anomalies  Palpation: No palpable anomalies    Assessment   Status Diagnosis  Controlled Controlled Controlled 1. Chronic pain syndrome   2. Lumbar facet arthropathy   3. Lumbar spondylosis   4. Lumbar degenerative disc disease   5. Cervical facet joint syndrome   6. Cervical radicular pain   7. Localized osteoarthritis of shoulder  regions, bilateral   8. Neck pain      Updated Problems: Problem  Lumbar Facet Arthropathy   Clinical, physical exam and radiographic findings indicative of severe lumbar degeneration and facet disease.  Status post bilateral L3, L4, L5 facet medial branch nerve block on 09/26/2019 which unfortunately was not effective.  We discussed alternative therapies including Sprint peripheral nerve stimulation of lumbar medial branch nerves.  This was discussed in detail with the patient.  She was provided  resources to review.  Can consider in future.      Plan of Care  Ebony Anderson has a current medication list which includes the following long-term medication(s): esomeprazole, gabapentin, lisinopril, and pravastatin.  Pharmacotherapy (Medications Ordered): Meds ordered this encounter  Medications  . gabapentin (NEURONTIN) 300 MG capsule    Sig: Take 1 capsule (300 mg total) by mouth at bedtime.    Dispense:  30 capsule    Refill:  1  . traMADol (ULTRAM) 50 MG tablet    Sig: Take 1 tablet (50 mg total) by mouth daily as needed for severe pain. Month last 30 days.    Dispense:  30 tablet    Refill:  1    [place] STOP ACT - Not applicable. Fill one day early if pharmacy is closed on scheduled refill date.   Follow-up plan:   Return in about 8 weeks (around 02/02/2020) for Medication Management, in person.     Status post diagnostic L3, L4, L5 facet medial branch nerve block 09/26/2019: Not effective, consider sprint peripheral nerve stimulation of medial branch patient provided resources.     Recent Visits Date Type Provider Dept  10/31/19 Office Visit Gillis Santa, MD [facility]  09/26/19 Procedure visit Gillis Santa, MD [facility]  09/12/19 Office Visit Gillis Santa, MD [facility]  Showing recent visits within past 90 days and meeting all other requirements Today's Visits Date Type Provider Dept  12/08/19 Office Visit Gillis Santa, MD [facility]  Showing today's visits and meeting all other requirements Future Appointments Date Type Provider Dept  02/02/20 Appointment Gillis Santa, MD [facility]  Showing future appointments within next 90 days and meeting all other requirements  I discussed the assessment and treatment plan with the patient. The patient was provided an opportunity to ask questions and all were answered. The patient agreed with the plan and demonstrated an understanding of the instructions.  Patient advised to call back or seek an in-person evaluation if the symptoms or condition worsens.  Duration of encounter:35 minutes.  Note by: Gillis Santa, MD Date: 12/08/2019; Time: 12:50 PM

## 2019-12-08 NOTE — Progress Notes (Signed)
Safety precautions to be maintained throughout the outpatient stay will include: orient to surroundings, keep bed in low position, maintain call bell within reach at all times, provide assistance with transfer out of bed and ambulation.  

## 2020-01-10 ENCOUNTER — Other Ambulatory Visit: Payer: Self-pay

## 2020-01-10 ENCOUNTER — Encounter: Payer: Self-pay | Admitting: Cardiovascular Disease

## 2020-01-10 ENCOUNTER — Ambulatory Visit: Payer: Medicare PPO | Admitting: Cardiovascular Disease

## 2020-01-10 VITALS — BP 140/70 | HR 85 | Ht 65.0 in | Wt 144.0 lb

## 2020-01-10 DIAGNOSIS — I493 Ventricular premature depolarization: Secondary | ICD-10-CM

## 2020-01-10 DIAGNOSIS — I10 Essential (primary) hypertension: Secondary | ICD-10-CM

## 2020-01-10 NOTE — Patient Instructions (Signed)
Medication Instructions:  Your physician recommends that you continue on your current medications as directed. Please refer to the Current Medication list given to you today.  *If you need a refill on your cardiac medications before your next appointment, please call your pharmacy*   Lab Work: None ordered If you have labs (blood work) drawn today and your tests are completely normal, you will receive your results only by: Marland Kitchen MyChart Message (if you have MyChart) OR . A paper copy in the mail If you have any lab test that is abnormal or we need to change your treatment, we will call you to review the results.   Testing/Procedures: Your physician has requested that you have an echocardiogram. Echocardiography is a painless test that uses sound waves to create images of your heart. It provides your doctor with information about the size and shape of your heart and how well your heart's chambers and valves are working. This procedure takes approximately one hour. There are no restrictions for this procedure.  Your physician has recommended that you wear a Zio monitor XT (To be worn for 3 days). This monitor is a medical device that records the heart's electrical activity. Doctors most often use these monitors to diagnose arrhythmias. Arrhythmias are problems with the speed or rhythm of the heartbeat. The monitor is a small device applied to your chest. You can wear one while you do your normal daily activities. While wearing this monitor if you have any symptoms to push the button and record what you felt. Once you have worn this monitor for the period of time provider prescribed (Usually 14 days), you will return the monitor device in the postage paid box. Once it is returned they will download the data collected and provide [place] with a report which the provider will then review and we will call you with those results. Important tips:  1. Avoid showering during the first 24 hours of wearing the  monitor. 2. Avoid excessive sweating to help maximize wear time. 3. Do not submerge the device, no hot tubs, and no swimming pools. 4. Keep any lotions or oils away from the patch. 5. After 24 hours you may shower with the patch on. Take brief showers with your back facing the shower head.  6. Do not remove patch once it has been placed because that will interrupt data and decrease adhesive wear time. 7. Push the button when you have any symptoms and write down what you were feeling. 8. Once you have completed wearing your monitor, remove and place into box which has postage paid and place in your outgoing mailbox.  9. If for some reason you have misplaced your box then call our office and we can provide another box and/or mail it off for you.         Follow-Up: At [facility], you and your health needs are our priority.  As part of our continuing mission to provide you with exceptional heart care, we have created designated Provider Care Teams.  These Care Teams include your primary Cardiologist (physician) and Advanced Practice Providers (APPs -  Physician Assistants and Nurse Practitioners) who all work together to provide you with the care you need, when you need it.  We recommend signing up for the patient portal called "MyChart".  Sign up information is provided on this After Visit Summary.  MyChart is used to connect with patients for Virtual Visits (Telemedicine).  Patients are able to view lab/test results, encounter notes, upcoming appointments,  etc.  Non-urgent messages can be sent to your provider as well.   To learn more about what you can do with MyChart, go to [email or web address].    Your next appointment:   2 month(s)  The format for your next appointment:   In Person  Provider:    You may see  Dr. Kirke Corin or one of the following Advanced Practice Providers on your designated Care Team:    Nicolasa Ducking, NP  Eula Listen, PA-C  Marisue Ivan,  PA-C    Other Instructions N/A

## 2020-01-10 NOTE — Progress Notes (Signed)
Cardiology Office Note   Date:  01/10/2020   ID:  Ebony Anderson, DOB 1933/03/05, MRN [id number]  PCP:  Leanna Sato, MD  Cardiologist:   Lorine Bears, MD   Chief Complaint  Patient presents with  . New Patient (Initial Visit)    Referred by Dr. Marvis Moeller for palpitations; Meds verbally reviewed with patient.      History of Present Illness: Ebony Anderson is a 86 y.o. female who was referred by Dr. Marvis Moeller for evaluation of frequent PVCs.  She has no prior cardiac history.  She has no history of diabetes or tobacco use.  Past medical history include essential hypertension, GERD and Hashimoto's thyroiditis status post thyroidectomy currently on thyroid replacement therapy. The patient noted irregular heartbeats over the last few months but for the most part she has been asymptomatic.  No chest pain, shortness of breath or palpitations.  No dizziness or syncope.  She is to be very active and swims for exercise on a regular basis but has not been able to do so since due to restrictions related to COVID-19.  In addition, she has been having low back pain requiring attending the pain clinic and this has limited her activities.  She consumes relatively small amounts of caffeine daily.    Past Medical History:  Diagnosis Date  . Hyperlipidemia   . Hypertension   . Thyroid disease     Past Surgical History:  Procedure Laterality Date  . ABDOMINAL HYSTERECTOMY    . ABDOMINOPLASTY    . RHINOPLASTY    . THYROIDECTOMY    . TONSILLECTOMY       Current Outpatient Medications  Medication Sig Dispense Refill  . cholecalciferol (VITAMIN D3) 25 MCG (1000 UT) tablet Take 5,000 Units by mouth daily. TAKE 2 TABLETS (2000MG ) BY MOUTH ONCE DAILY.     . diazepam (VALIUM) 10 MG tablet Take 10 mg by mouth daily as needed for anxiety. TAKE 1 TABLET BY MOUTH ONCE DAILY AS NEEDED.    esomeprazole (NEXIUM) 40 MG capsule Take 40 mg by mouth daily.    Marland Kitchen gabapentin (NEURONTIN) 300 MG capsule Take 1  capsule (300 mg total) by mouth at bedtime. 30 capsule 1  . ketoconazole (NIZORAL) 2 % shampoo Apply 1 application topically every other day.    . levothyroxine (SYNTHROID) 112 MCG tablet Take 112 mcg by mouth daily.    Marland Kitchen lisinopril (PRINIVIL,ZESTRIL) 40 MG tablet Take 40 mg by mouth daily. TAKE 1 TABLET BY MOUTH ONCE DAILY.    . mometasone (ELOCON) 0.1 % lotion Apply 0.1 application topically daily.    . pravastatin (PRAVACHOL) 40 MG tablet Take 40 mg by mouth daily. TAKE 1 TABLET BY MOUTH ONCE DAILY.    . traMADol (ULTRAM) 50 MG tablet Take 1 tablet (50 mg total) by mouth daily as needed for severe pain. Month last 30 days. 30 tablet 1  . magnesium oxide (MAG-OX) 400 MG tablet Take 400 mg by mouth as needed. (Patient not taking: Reported on 12/08/2019)     No current facility-administered medications for this visit.    Allergies:   Aspirin, Codeine, and Sulfa antibiotics    Social History:  The patient  reports that she has never smoked. She has never used smokeless tobacco. She reports that she does not drink alcohol and does not use drugs.   Family History:  The patient's family history is remarkable for coronary artery disease in her father but he was a smoker.   ROS:  Please see the history of present illness.   Otherwise, review of systems are positive for none.   All other systems are reviewed and negative.    PHYSICAL EXAM: VS:  BP 140/70 (BP Location: Right Arm, Patient Position: Sitting, Cuff Size: Normal)   Pulse 85   Ht 5\' 5"  (1.651 m)   Wt 144 lb (65.3 kg)   BMI 23.96 kg/m  , BMI Body mass index is 23.96 kg/m. GEN: Well nourished, well developed, in no acute distress  HEENT: normal  Neck: no JVD, carotid bruits, or masses Cardiac: RRR with frequent premature beats; no murmurs, rubs, or gallops,no edema  Respiratory:  clear to auscultation bilaterally, normal work of breathing GI: soft, nontender, nondistended, + BS MS: no deformity or atrophy  Skin: warm and dry, no  rash Neuro:  Strength and sensation are intact Psych: euthymic mood, full affect   EKG:  EKG is ordered today. The ekg ordered today demonstrates sinus rhythm with frequent PVCs.  No significant ST or T wave changes.  No evidence of prior infarct.   Recent Labs: No results found for requested labs within last 8760 hours.    Lipid Panel No results found for: CHOL, TRIG, HDL, CHOLHDL, VLDL, LDLCALC, LDLDIRECT    Wt Readings from Last 3 Encounters:  01/10/20 144 lb (65.3 kg)  12/08/19 145 lb (65.8 kg)  10/31/19 146 lb (66.2 kg)       PAD Screen 01/10/2020  Previous PAD dx? No  Previous surgical procedure? No  Pain with walking? No  Feet/toe relief with dangling? No  Painful, non-healing ulcers? No  Extremities discolored? No      ASSESSMENT AND PLAN:  1.  Frequent PVCs: Patient is overall asymptomatic but we have to quantify the burden of PVC to see if treatment is needed to prevent cardiomyopathy.  I requested a 3-week outpatient monitor.  In addition, we have to exclude underlying structural heart disease.  I requested an echocardiogram.  She has no symptoms suggestive of angina and thus we will hold off on ischemic cardiac evaluation.  2.  Essential hypertension blood pressure is controlled     Disposition:   The patient initially agreed to undergo the requested testing but then changed her mind and she will call 01/12/2020 when she is ready to schedule the monitor and echocardiogram.  She is to follow-up with me after.  Signed,  [place], MD  01/10/2020 3:29 PM    [facility]

## 2020-01-17 ENCOUNTER — Other Ambulatory Visit: Payer: Self-pay

## 2020-01-17 ENCOUNTER — Ambulatory Visit (INDEPENDENT_AMBULATORY_CARE_PROVIDER_SITE_OTHER): Payer: Medicare PPO | Admitting: Cardiology

## 2020-01-17 ENCOUNTER — Encounter: Payer: Self-pay | Admitting: Cardiology

## 2020-01-17 DIAGNOSIS — E782 Mixed hyperlipidemia: Secondary | ICD-10-CM | POA: Diagnosis not present

## 2020-01-17 DIAGNOSIS — I493 Ventricular premature depolarization: Secondary | ICD-10-CM

## 2020-01-17 DIAGNOSIS — I10 Essential (primary) hypertension: Secondary | ICD-10-CM | POA: Diagnosis not present

## 2020-01-17 MED ORDER — NITROGLYCERIN 0.4 MG SL SUBL
0.4000 mg | SUBLINGUAL_TABLET | SUBLINGUAL | 3 refills | Status: AC | PRN
Start: 2020-01-17 — End: ?

## 2020-01-17 NOTE — Progress Notes (Signed)
Cardiology Office Note:    Date:  01/17/2020   ID:  Ebony Anderson, DOB [birth year], MRN [id number]  PCP:  Leanna Sato, MD  Cardiologist:  Garwin Brothers, MD   Referring MD: Leanna Sato, MD    ASSESSMENT:    1. Essential hypertension   2. Mixed dyslipidemia   3. Frequent PVCs    PLAN:    In order of problems listed above:  1. Primary prevention stressed with patient.  Importance of compliance with diet medication stressed and she vocalized understanding. 2. Chest tightness: I recommended evaluation with stress testing but the patient is not keen on it.  I respect her wishes.  Sublingual nitroglycerin prescription was sent, its protocol and 911 protocol explained and the patient vocalized understanding questions were answered to the patient's satisfaction.  Risks of myocardial infarction, coronary ischemia etc. were discussed with the patient and she and her husband vocalized understanding. 3. Essential hypertension: Blood pressure stable 4. Cardiac murmur and frequent PVCs: Echocardiogram will be done to assess murmur heard on auscultation.  Patient has already been ordered a 30-day monitor to assess this. 5. Patient will be seen in follow-up appointment in 6 months or earlier if the patient has any concerns    Medication Adjustments/Labs and Tests Ordered: Current medicines are reviewed at length with the patient today.  Concerns regarding medicines are outlined above.  Orders Placed This Encounter  Procedures  . LONG TERM MONITOR (3-14 DAYS)  . EKG 12-Lead   Meds ordered this encounter  Medications  . nitroGLYCERIN (NITROSTAT) 0.4 MG SL tablet    Sig: Place 1 tablet (0.4 mg total) under the tongue every 5 (five) minutes as needed for chest pain.    Dispense:  25 tablet    Refill:  3     No chief complaint on file.    History of Present Illness:    Ebony Anderson is a 86 y.o. female.  Patient has past medical history of essential hypertension dyslipidemia.  She  was evaluated by my colleagues at the other clinic for palpitations and frequent PVCs.  She denies any problems at this time and takes care of activities of daily living.  No chest pain orthopnea or PND.  She has no significant palpitations.  She leads a very sedentary lifestyle because of back pain issues.  After being evaluated by me back in another clinic patient mentions to me that she had an episode of chest pain at rest relieved with aspirin intake.  No orthopnea or PND.  At the time of my evaluation, the patient is alert awake oriented and in no distress.  Past Medical History:  Diagnosis Date  . Hyperlipidemia   . Hypertension   . Thyroid disease     Past Surgical History:  Procedure Laterality Date  . ABDOMINAL HYSTERECTOMY    . ABDOMINOPLASTY    . RHINOPLASTY    . THYROIDECTOMY    . TONSILLECTOMY      Current Medications: Current Meds  Medication Sig  . cholecalciferol (VITAMIN D3) 25 MCG (1000 UT) tablet Take 5,000 Units by mouth daily. TAKE 2 TABLETS (2000MG ) BY MOUTH ONCE DAILY.   . diazepam (VALIUM) 10 MG tablet Take 10 mg by mouth daily as needed for anxiety. TAKE 1 TABLET BY MOUTH ONCE DAILY AS NEEDED.  esomeprazole (NEXIUM) 40 MG capsule Take 40 mg by mouth daily.  Marland Kitchen gabapentin (NEURONTIN) 300 MG capsule Take 1 capsule (300 mg total) by mouth at bedtime.  . Levothyroxine  Sodium 112 MCG CAPS Take 112 mcg by mouth daily.  Marland Kitchen lisinopril (PRINIVIL,ZESTRIL) 40 MG tablet Take 40 mg by mouth daily. TAKE 1 TABLET BY MOUTH ONCE DAILY.  . pravastatin (PRAVACHOL) 40 MG tablet Take 40 mg by mouth daily. TAKE 1 TABLET BY MOUTH ONCE DAILY.  . traMADol (ULTRAM) 50 MG tablet Take 1 tablet (50 mg total) by mouth daily as needed for severe pain. Month last 30 days.     Allergies:   Aspirin, Codeine, and Sulfa antibiotics   Social History   Socioeconomic History  . Marital status: Married    Spouse name: Not on file  . Number of children: Not on file  . Years of education: Not  on file  . Highest education level: Not on file  Occupational History  . Not on file  Tobacco Use  . Smoking status: Never Smoker  . Smokeless tobacco: Never Used  Vaping Use  . Vaping Use: Never used  Substance and Sexual Activity  . Alcohol use: Never  . Drug use: Never  . Sexual activity: Not Currently  Other Topics Concern  . Not on file  Social History Narrative  . Not on file   Social Determinants of Health   Financial Resource Strain:   . Difficulty of Paying Living Expenses: Not on file  Food Insecurity:   . Worried About Programme researcher, broadcasting/film/video in the Last Year: Not on file  . Ran Out of Food in the Last Year: Not on file  Transportation Needs:   . Lack of Transportation (Medical): Not on file  . Lack of Transportation (Non-Medical): Not on file  Physical Activity:   . Days of Exercise per Week: Not on file  . Minutes of Exercise per Session: Not on file  Stress:   . Feeling of Stress : Not on file  Social Connections:   . Frequency of Communication with Friends and Family: Not on file  . Frequency of Social Gatherings with Friends and Family: Not on file  . Attends Religious Services: Not on file  . Active Member of Clubs or Organizations: Not on file  . Attends Banker Meetings: Not on file  . Marital Status: Not on file     Family History: The patient's family history is negative for Thyroid disease.  ROS:   Please see the history of present illness.    All other systems reviewed and are negative.  EKGs/Labs/Other Studies Reviewed:    The following studies were reviewed today: EKG reveals sinus rhythm with frequent PVCs   Recent Labs: No results found for requested labs within last 8760 hours.  Recent Lipid Panel No results found for: CHOL, TRIG, HDL, CHOLHDL, VLDL, LDLCALC, LDLDIRECT  Physical Exam:    VS:  BP 132/68   Pulse 91   Ht 5\' 5"  (1.651 m)   Wt 142 lb 9.6 oz (64.7 kg)   SpO2 95%   BMI 23.73 kg/m     Wt Readings from  Last 3 Encounters:  01/17/20 142 lb 9.6 oz (64.7 kg)  01/10/20 144 lb (65.3 kg)  12/08/19 145 lb (65.8 kg)     GEN: Patient is in no acute distress HEENT: Normal NECK: No JVD; No carotid bruits LYMPHATICS: No lymphadenopathy CARDIAC: Hear sounds regular, 2/6 systolic murmur at the apex. RESPIRATORY:  Clear to auscultation without rales, wheezing or rhonchi  ABDOMEN: Soft, non-tender, non-distended MUSCULOSKELETAL:  No edema; No deformity  SKIN: Warm and dry NEUROLOGIC:  Alert and oriented x  3 PSYCHIATRIC:  Normal affect   Signed, Garwin Brothers, MD  01/17/2020 3:05 PM    [facility]

## 2020-01-17 NOTE — Patient Instructions (Addendum)
Medication Instructions:  Start Nitroglycerin 0.4 mg every 5 minutes as needed for chest pain  *If you need a refill on your cardiac medications before your next appointment, please call your pharmacy*   Lab Work: None ordered   If you have labs (blood work) drawn today and your tests are completely normal, you will receive your results only by: Marland Kitchen MyChart Message (if you have MyChart) OR . A paper copy in the mail If you have any lab test that is abnormal or we need to change your treatment, we will call you to review the results.   Testing/Procedures: A zio monitor was ordered today. It will remain on for 3 days. You will then return monitor and event diary in provided box. It takes 1-2 weeks for report to be downloaded and returned to [place]. We will call you with the results. If monitor falls off or has orange flashing light, please call Zio for further instructions.     Follow-Up: At [facility], you and your health needs are our priority.  As part of our continuing mission to provide you with exceptional heart care, we have created designated Provider Care Teams.  These Care Teams include your primary Cardiologist (physician) and Advanced Practice Providers (APPs -  Physician Assistants and Nurse Practitioners) who all work together to provide you with the care you need, when you need it.  We recommend signing up for the patient portal called "MyChart".  Sign up information is provided on this After Visit Summary.  MyChart is used to connect with patients for Virtual Visits (Telemedicine).  Patients are able to view lab/test results, encounter notes, upcoming appointments, etc.  Non-urgent messages can be sent to your provider as well.   To learn more about what you can do with MyChart, go to [email or web address].    Your next appointment:   4 months   The format for your next appointment:   In Person  Provider:   Belva Crome, MD   Other Instructions None

## 2020-01-30 ENCOUNTER — Ambulatory Visit (INDEPENDENT_AMBULATORY_CARE_PROVIDER_SITE_OTHER): Payer: Medicare PPO

## 2020-01-30 DIAGNOSIS — I493 Ventricular premature depolarization: Secondary | ICD-10-CM

## 2020-02-02 ENCOUNTER — Telehealth: Payer: Self-pay | Admitting: *Deleted

## 2020-02-02 ENCOUNTER — Encounter: Payer: Self-pay | Admitting: Student in an Organized Health Care Education/Training Program

## 2020-02-02 ENCOUNTER — Ambulatory Visit
Payer: Medicare PPO | Attending: Student in an Organized Health Care Education/Training Program | Admitting: Student in an Organized Health Care Education/Training Program

## 2020-02-02 ENCOUNTER — Other Ambulatory Visit: Payer: Self-pay

## 2020-02-02 VITALS — BP 93/45 | HR 76 | Resp 16 | Ht 65.0 in | Wt 142.0 lb

## 2020-02-02 DIAGNOSIS — M19012 Primary osteoarthritis, left shoulder: Secondary | ICD-10-CM

## 2020-02-02 DIAGNOSIS — M47816 Spondylosis without myelopathy or radiculopathy, lumbar region: Secondary | ICD-10-CM | POA: Diagnosis not present

## 2020-02-02 DIAGNOSIS — M5136 Other intervertebral disc degeneration, lumbar region: Secondary | ICD-10-CM | POA: Diagnosis present

## 2020-02-02 DIAGNOSIS — G894 Chronic pain syndrome: Secondary | ICD-10-CM | POA: Insufficient documentation

## 2020-02-02 DIAGNOSIS — M19011 Primary osteoarthritis, right shoulder: Secondary | ICD-10-CM | POA: Insufficient documentation

## 2020-02-02 DIAGNOSIS — M47812 Spondylosis without myelopathy or radiculopathy, cervical region: Secondary | ICD-10-CM | POA: Diagnosis present

## 2020-02-02 DIAGNOSIS — M5412 Radiculopathy, cervical region: Secondary | ICD-10-CM | POA: Diagnosis present

## 2020-02-02 MED ORDER — TRAMADOL HCL 50 MG PO TABS: 50.0000 mg | ORAL_TABLET | Freq: Every day | ORAL | 1 refills | Status: AC | PRN

## 2020-02-02 MED ORDER — GABAPENTIN 300 MG PO CAPS: 300.0000 mg | ORAL_CAPSULE | Freq: Every day | ORAL | 1 refills | Status: DC

## 2020-02-02 NOTE — Progress Notes (Signed)
PROVIDER NOTE: Information contained herein reflects review and annotations entered in association with encounter. Interpretation of such information and data should be left to medically-trained personnel. Information provided to patient can be located elsewhere in the medical record under "Patient Instructions". Document created using STT-dictation technology, any transcriptional errors that may result from process are unintentional.    Patient: Ebony Anderson  Service Category: E/M  Provider: Gillis Santa, MD  DOB: [birth year]  DOS: 02/02/2020  Specialty: Interventional Pain Management  MRN: [id number]  Setting: Ambulatory outpatient  PCP: Marguerita Merles, MD  Type: Established Patient    Referring Provider: Marguerita Merles, MD  Location: Office  Delivery: Face-to-face     HPI  Reason for encounter: Ms. Ebony Anderson, a 86 y.o. year old female, is here today for evaluation and management of her Chronic pain syndrome [G89.4]. [name]'s primary complain today is Back Pain (lower) Last encounter: Practice (12/08/2019). My last encounter with her was on 12/08/2019. Pertinent problems: [name] has Lumbar facet arthropathy; Lumbar degenerative disc disease; Chronic pain syndrome; Cervical radicular pain; Pain of both shoulder joints; Localized osteoarthritis of shoulder regions, bilateral; and Cervical facet joint syndrome on their pertinent problem list. Pain Assessment: Severity of Chronic pain is reported as a 8 /10. Location: Back Lower/denies. Onset: More than a month ago. Quality: Stabbing. Timing: Intermittent. Modifying factor(s): Tramadol, rest, gabapentin. Vitals:  height is _0  (1.651 m) and weight is 142 lb (64.4 kg). Her blood pressure is 93/45 (abnormal) and her pulse is 76. Her respiration is 16 and oxygen saturation is 100%.   No change in medical history since last visit.  Patient's pain is at baseline.  Patient continues multimodal pain regimen as prescribed.  States that it provides pain  relief and improvement in functional status.   Pharmacotherapy Assessment   Analgesic:  Tramadol 50 mg daily as needed,  Monitoring: [place] PMP: PDMP not reviewed this encounter.       Pharmacotherapy: No side-effects or adverse reactions reported. Compliance: No problems identified. Effectiveness: Clinically acceptable.  Hart Rochester, RN  02/02/2020  2:03 PM  Sign when Signing Visit Nursing Pain Medication Assessment:  Safety precautions to be maintained throughout the outpatient stay will include: orient to surroundings, keep bed in low position, maintain call bell within reach at all times, provide assistance with transfer out of bed and ambulation.  Medication Inspection Compliance: yes  Medication: Tramadol (Ultram) Pill/Patch Count: 25 of ? pills remain Pill/Patch Appearance: Markings consistent with prescribed medication Bottle Appearance: Old prescription bottle. Patient reminded that medications should always be kept in the newest prescription bottle. Last Medication intake:  Today    UDS:  Summary  Date Value Ref Range Status  09/13/2019 Note  Final    Comment:    ==================================================================== Compliance Drug Analysis, Ur ==================================================================== Test                             Result       Flag       Units Drug Present and Declared for Prescription Verification   Desmethyldiazepam              628          EXPECTED   ng/mg creat   Oxazepam                       >1235        EXPECTED  ng/mg creat   Temazepam                      1078         EXPECTED   ng/mg creat    Desmethyldiazepam, oxazepam, and temazepam are expected metabolites    of diazepam. Desmethyldiazepam and oxazepam are also expected    metabolites of other drugs, including chlordiazepoxide, prazepam,    clorazepate, and halazepam. Oxazepam is an expected metabolite of    temazepam. Oxazepam and temazepam are also  available as scheduled    prescription medications. Drug Present not Declared for Prescription Verification   Acetaminophen                  PRESENT      UNEXPECTED Drug Absent but Declared for Prescription Verification   Tramadol                       Not Detected UNEXPECTED ng/mg creat   Gabapentin                     Not Detected UNEXPECTED ==================================================================== Test                      Result    Flag   Units      Ref Range   Creatinine              162              mg/dL      >=20 ==================================================================== Declared Medications:  The flagging and interpretation on this report are based on the  following declared medications.  Unexpected results may arise from  inaccuracies in the declared medications.  **Note: The testing scope of this panel includes these medications:  Diazepam (Valium)  Gabapentin (Neurontin)  Tramadol (Ultram)  **Note: The testing scope of this panel does not include the  following reported medications:  Esomeprazole (Nexium)  Levothyroxine (Synthroid)  Lisinopril (Zestril)  Magnesium (Mag-Ox)  Pravastatin (Pravachol)  Topical  Vitamin D3 ==================================================================== For clinical consultation, please call [phone]. ====================================================================      ROS  Constitutional: Denies any fever or chills Gastrointestinal: No reported hemesis, hematochezia, vomiting, or acute GI distress Musculoskeletal: +LBP Neurological: No reported episodes of acute onset apraxia, aphasia, dysarthria, agnosia, amnesia, paralysis, loss of coordination, or loss of consciousness  Medication Review  Levothyroxine Sodium, cholecalciferol, diazepam, esomeprazole, gabapentin, lisinopril, nitroGLYCERIN, pravastatin, and traMADol  History Review  Allergy: [name] is allergic to aspirin, codeine, and sulfa  antibiotics. Drug: [name]  reports no history of drug use. Alcohol:  reports no history of alcohol use. Tobacco:  reports that she has never smoked. She has never used smokeless tobacco. Social: Ms. Clover  reports that she has never smoked. She has never used smokeless tobacco. She reports that she does not drink alcohol and does not use drugs. Medical:  has a past medical history of Hyperlipidemia, Hypertension, and Thyroid disease. Surgical: Ms. Cuda  has a past surgical history that includes Thyroidectomy; Abdominal hysterectomy; Tonsillectomy; Rhinoplasty; and Abdominoplasty. Family: family history is not on file.  Laboratory Chemistry Profile   Renal No results found for: BUN, CREATININE, LABCREA, BCR, GFR, GFRAA, GFRNONAA, LABVMA, EPIRU, EPINEPH24HUR, NOREPRU, NOREPI24HUR, DOPARU, [id number]   Hepatic No results found for: AST, ALT, ALBUMIN, ALKPHOS, HCVAB, AMYLASE, LIPASE, AMMONIA   Electrolytes No results found for: NA, K, CL, CALCIUM, MG, PHOS   Bone  No results found for: VD25OH, [id number], [id number], [id number], 25OHVITD1, 25OHVITD2, 25OHVITD3, TESTOFREE, TESTOSTERONE   Inflammation (CRP: Acute Phase) (ESR: Chronic Phase) No results found for: CRP, ESRSEDRATE, LATICACIDVEN     Note: Above Lab results reviewed.  Recent Imaging Review  DG PAIN CLINIC C-ARM 1-60 MIN NO REPORT Fluoro was used, but no Radiologist interpretation will be provided.  Please refer to "NOTES" tab for provider progress note. Note: Reviewed        Physical Exam  General appearance: Well nourished, well developed, and well hydrated. In no apparent acute distress Mental status: Alert, oriented x 3 (person, place, & time)       Respiratory: No evidence of acute respiratory distress Eyes: PERLA Vitals: BP (!) 93/45 Comment: right 111/49  Pulse 76   Resp 16   Ht _0  (1.651 m)   Wt 142 lb (64.4 kg)   SpO2 100%   BMI 23.63 kg/m  BMI: Estimated body mass index is 23.63 kg/m as  calculated from the following:   Height as of this encounter: _1  (1.651 m).   Weight as of this encounter: 142 lb (64.4 kg). Ideal: Ideal body weight: 57 kg (125 lb 10.6 oz) Adjusted ideal body weight: 60 kg (132 lb 3.2 oz)  Lumbar Spine Area Exam  Skin & Axial Inspection: No masses, redness, or swelling Alignment: Levoscoliosis Functional ROM: Pain restricted ROM       Stability: No instability detected Muscle Tone/Strength: Functionally intact. No obvious neuro-muscular anomalies detected. Sensory (Neurological): Musculoskeletal pain pattern  Gait & Posture Assessment  Ambulation: Limited Gait: Antalgic Posture: Difficulty standing up straight, due to pain  Lower Extremity Exam    Side: Right lower extremity  Side: Left lower extremity  Stability: No instability observed          Stability: No instability observed          Skin & Extremity Inspection: Skin color, temperature, and hair growth are WNL. No peripheral edema or cyanosis. No masses, redness, swelling, asymmetry, or associated skin lesions. No contractures.  Skin & Extremity Inspection: Skin color, temperature, and hair growth are WNL. No peripheral edema or cyanosis. No masses, redness, swelling, asymmetry, or associated skin lesions. No contractures.  Functional ROM: Pain restricted ROM for hip joint          Functional ROM: Pain restricted ROM for hip joint          Muscle Tone/Strength: Functionally intact. No obvious neuro-muscular anomalies detected.  Muscle Tone/Strength: Functionally intact. No obvious neuro-muscular anomalies detected.  Sensory (Neurological): Unimpaired        Sensory (Neurological): Unimpaired        DTR: Patellar: deferred today Achilles: deferred today Plantar: deferred today  DTR: Patellar: deferred today Achilles: deferred today Plantar: deferred today  Palpation: No palpable anomalies  Palpation: No palpable anomalies     Assessment   Status Diagnosis   Controlled Controlled Controlled 1. Chronic pain syndrome   2. Lumbar facet arthropathy   3. Lumbar degenerative disc disease   4. Cervical facet joint syndrome   5. Lumbar spondylosis   6. Cervical radicular pain   7. Localized osteoarthritis of shoulder regions, bilateral        Plan of Care   Ms. Carolanne Mercier has a current medication list which includes the following long-term medication(s): esomeprazole, gabapentin, lisinopril, nitroglycerin, and pravastatin.  Pharmacotherapy (Medications Ordered): Meds ordered this encounter  Medications  . traMADol (ULTRAM) 50 MG tablet    Sig: Take 1 tablet (  50 mg total) by mouth daily as needed for severe pain. Month last 30 days.    Dispense:  30 tablet    Refill:  1    [place] STOP ACT - Not applicable. Fill one day early if pharmacy is closed on scheduled refill date.  . gabapentin (NEURONTIN) 300 MG capsule    Sig: Take 1 capsule (300 mg total) by mouth at bedtime.    Dispense:  30 capsule    Refill:  1   Follow-up plan:   Return in about 8 weeks (around 03/29/2020) for Medication Management, in person.     Status post diagnostic L3, L4, L5 facet medial branch nerve block 09/26/2019: Not effective, consider sprint peripheral nerve stimulation of medial branch patient provided resources.      Recent Visits Date Type Provider Dept  12/08/19 Office Visit Gillis Santa, MD [facility]  Showing recent visits within past 90 days and meeting all other requirements Today's Visits Date Type Provider Dept  02/02/20 Office Visit Gillis Santa, MD [facility]  Showing today's visits and meeting all other requirements Future Appointments Date Type Provider Dept  03/27/20 Appointment Gillis Santa, MD [facility]  Showing future appointments within next 90 days and meeting all other requirements  I discussed the assessment and treatment plan with the patient. The patient was provided an opportunity to ask  questions and all were answered. The patient agreed with the plan and demonstrated an understanding of the instructions.  Patient advised to call back or seek an in-person evaluation if the symptoms or condition worsens.  Duration of encounter: 21mnutes.  Note by: BGillis Santa MD Date: 02/02/2020; Time: 3:10 PM

## 2020-02-02 NOTE — Patient Instructions (Addendum)
Please call [facility] in [place] to cancel Tramadol Rx  New Rx sent to [facility]  Prescriptions for Gabapentin and Tramadol sent to your pharmacy.

## 2020-02-02 NOTE — Progress Notes (Signed)
Nursing Pain Medication Assessment:  Safety precautions to be maintained throughout the outpatient stay will include: orient to surroundings, keep bed in low position, maintain call bell within reach at all times, provide assistance with transfer out of bed and ambulation.  Medication Inspection Compliance: yes  Medication: Tramadol (Ultram) Pill/Patch Count: 25 of ? pills remain Pill/Patch Appearance: Markings consistent with prescribed medication Bottle Appearance: Old prescription bottle. Patient reminded that medications should always be kept in the newest prescription bottle. Last Medication intake:  Today

## 2020-02-02 NOTE — Telephone Encounter (Signed)
Cancelled all outstanding prescriptions for Tramadol.

## 2020-02-08 ENCOUNTER — Ambulatory Visit (INDEPENDENT_AMBULATORY_CARE_PROVIDER_SITE_OTHER): Payer: Medicare PPO

## 2020-02-08 ENCOUNTER — Other Ambulatory Visit: Payer: Self-pay

## 2020-02-08 DIAGNOSIS — I493 Ventricular premature depolarization: Secondary | ICD-10-CM | POA: Diagnosis not present

## 2020-02-08 LAB — ECHOCARDIOGRAM COMPLETE
Area-P 1/2: 4.1 cm2
S' Lateral: 2.4 cm

## 2020-02-08 NOTE — Progress Notes (Signed)
Complete echocardiogram performed.  Jimmy Rebeccah Ivins RDCS, RVT  

## 2020-03-26 ENCOUNTER — Telehealth: Payer: Self-pay | Admitting: Cardiovascular Disease

## 2020-03-26 NOTE — Telephone Encounter (Signed)
Patient calling  Patient is afraid of getting a gum infection when she is having some dental work done Would like to know if we can prescribe her antibiotics Please call to discuss

## 2020-03-27 ENCOUNTER — Encounter: Payer: Medicare PPO | Admitting: Student in an Organized Health Care Education/Training Program

## 2020-03-28 NOTE — Telephone Encounter (Signed)
She does not require an antibiotic from a cardiac standpoint.  She should discuss her concerns with her dentist.

## 2020-03-29 NOTE — Telephone Encounter (Signed)
Patient made aware of Dr. Arida's response and recommendation with verbalized understanding. 

## 2020-04-25 ENCOUNTER — Encounter: Payer: Medicare PPO | Admitting: Student in an Organized Health Care Education/Training Program

## 2020-05-01 ENCOUNTER — Ambulatory Visit
Payer: Medicare PPO | Attending: Student in an Organized Health Care Education/Training Program | Admitting: Student in an Organized Health Care Education/Training Program

## 2020-05-01 ENCOUNTER — Other Ambulatory Visit: Payer: Self-pay

## 2020-05-01 ENCOUNTER — Encounter: Payer: Self-pay | Admitting: Student in an Organized Health Care Education/Training Program

## 2020-05-01 VITALS — BP 157/75 | HR 73 | Temp 97.6°F | Resp 18 | Ht 63.0 in | Wt 139.0 lb

## 2020-05-01 DIAGNOSIS — M19012 Primary osteoarthritis, left shoulder: Secondary | ICD-10-CM

## 2020-05-01 DIAGNOSIS — M5136 Other intervertebral disc degeneration, lumbar region: Secondary | ICD-10-CM

## 2020-05-01 DIAGNOSIS — M5412 Radiculopathy, cervical region: Secondary | ICD-10-CM | POA: Diagnosis not present

## 2020-05-01 DIAGNOSIS — M19011 Primary osteoarthritis, right shoulder: Secondary | ICD-10-CM

## 2020-05-01 DIAGNOSIS — M47816 Spondylosis without myelopathy or radiculopathy, lumbar region: Secondary | ICD-10-CM | POA: Diagnosis not present

## 2020-05-01 DIAGNOSIS — G894 Chronic pain syndrome: Secondary | ICD-10-CM

## 2020-05-01 DIAGNOSIS — M51369 Other intervertebral disc degeneration, lumbar region without mention of lumbar back pain or lower extremity pain: Secondary | ICD-10-CM

## 2020-05-01 DIAGNOSIS — M47812 Spondylosis without myelopathy or radiculopathy, cervical region: Secondary | ICD-10-CM

## 2020-05-01 MED ORDER — TRAMADOL HCL 50 MG PO TABS: 50.0000 mg | ORAL_TABLET | Freq: Every day | ORAL | 0 refills | Status: AC | PRN

## 2020-05-01 MED ORDER — GABAPENTIN 300 MG PO CAPS: 600.0000 mg | ORAL_CAPSULE | Freq: Every day | ORAL | 2 refills | Status: AC

## 2020-05-01 NOTE — Progress Notes (Signed)
Nursing Pain Medication Assessment:  Safety precautions to be maintained throughout the outpatient stay will include: orient to surroundings, keep bed in low position, maintain call bell within reach at all times, provide assistance with transfer out of bed and ambulation.  Medication Inspection Compliance: Ms. Ebony Anderson did not comply with our request to bring her pills to be counted. She was reminded that bringing the medication bottles, even when empty, is a requirement.  Medication: None brought in. Pill/Patch Count: None available to be counted. Bottle Appearance: No container available. Did not bring bottle(s) to appointment. Filled Date: N/A Last Medication intake:  Today

## 2020-05-01 NOTE — Progress Notes (Signed)
PROVIDER NOTE: Information contained herein reflects review and annotations entered in association with encounter. Interpretation of such information and data should be left to medically-trained personnel. Information provided to patient can be located elsewhere in the medical record under "Patient Instructions". Document created using STT-dictation technology, any transcriptional errors that may result from process are unintentional.    Patient: Ebony Anderson  Service Category: E/M  Provider: Gillis Santa, MD  DOB: [birth year]  DOS: 05/01/2020  Specialty: Interventional Pain Management  MRN: [id number]  Setting: Ambulatory outpatient  PCP: Marguerita Merles, MD  Type: Established Patient    Referring Provider: Marguerita Merles, MD  Location: Office  Delivery: Face-to-face     HPI  Ms. Ebony Anderson, a 86 y.o. year old female, is here today because of her Lumbar facet arthropathy [M47.816]. Ms. Ebony Anderson's primary complain today is Back Pain (low) Last encounter: My last encounter with her was on 02/02/2020. Pertinent problems: Ms. Ebony Anderson has Lumbar facet arthropathy; Lumbar degenerative disc disease; Chronic pain syndrome; Cervical radicular pain; Pain of both shoulder joints; Localized osteoarthritis of shoulder regions, bilateral; and Cervical facet joint syndrome on their pertinent problem list. Pain Assessment: Severity of Chronic pain is reported as a 8 /10. Location: Back Lower/denies. Onset: More than a month ago. Quality: Stabbing. Timing: Intermittent. Modifying factor(s): feels better walking. Vitals:  height is '5\' 3"'  (1.6 m) and weight is 139 lb (63 kg). Her temperature is 97.6 F (36.4 C). Her blood pressure is 157/75 (abnormal) and her pulse is 73. Her respiration is 18 and oxygen saturation is 100%.   Reason for encounter: medication management.   Dental extraction on Nov 18, still having dental pain. States that she is having difficulty talking. Patient is requesting PCN. Has seen dentist  again and he packed it and placed suture. Continues to have dental pain. Recommend increasing Gabapentin to 600 mg qhs  Pharmacotherapy Assessment   Analgesic: Tramadol 50 mg daily prn #30/month Monitoring: [place] PMP: PDMP reviewed during this encounter.       Pharmacotherapy: No side-effects or adverse reactions reported. Compliance: No problems identified. Effectiveness: Clinically acceptable.  Dewayne Shorter, RN  05/01/2020  1:08 PM  Signed Nursing Pain Medication Assessment:  Safety precautions to be maintained throughout the outpatient stay will include: orient to surroundings, keep bed in low position, maintain call bell within reach at all times, provide assistance with transfer out of bed and ambulation.  Medication Inspection Compliance: Ebony Anderson did not comply with our request to bring her pills to be counted. She was reminded that bringing the medication bottles, even when empty, is a requirement.  Medication: None brought in. Pill/Patch Count: None available to be counted. Bottle Appearance: No container available. Did not bring bottle(s) to appointment. Filled Date: N/A Last Medication intake:  Today    UDS:  Summary  Date Value Ref Range Status  09/13/2019 Note  Final    Comment:    ==================================================================== Compliance Drug Analysis, Ur ==================================================================== Test                             Result       Flag       Units Drug Present and Declared for Prescription Verification   Desmethyldiazepam              628          EXPECTED   ng/mg creat   Oxazepam                       >  1235        EXPECTED   ng/mg creat   Temazepam                      1078         EXPECTED   ng/mg creat    Desmethyldiazepam, oxazepam, and temazepam are expected metabolites    of diazepam. Desmethyldiazepam and oxazepam are also expected    metabolites of other drugs, including chlordiazepoxide,  prazepam,    clorazepate, and halazepam. Oxazepam is an expected metabolite of    temazepam. Oxazepam and temazepam are also available as scheduled    prescription medications. Drug Present not Declared for Prescription Verification   Acetaminophen                  PRESENT      UNEXPECTED Drug Absent but Declared for Prescription Verification   Tramadol                       Not Detected UNEXPECTED ng/mg creat   Gabapentin                     Not Detected UNEXPECTED ==================================================================== Test                      Result    Flag   Units      Ref Range   Creatinine              162              mg/dL      >=20 ==================================================================== Declared Medications:  The flagging and interpretation on this report are based on the  following declared medications.  Unexpected results may arise from  inaccuracies in the declared medications.  **Note: The testing scope of this panel includes these medications:  Diazepam (Valium)  Gabapentin (Neurontin)  Tramadol (Ultram)  **Note: The testing scope of this panel does not include the  following reported medications:  Esomeprazole (Nexium)  Levothyroxine (Synthroid)  Lisinopril (Zestril)  Magnesium (Mag-Ox)  Pravastatin (Pravachol)  Topical  Vitamin D3 ==================================================================== For clinical consultation, please call [phone]. ====================================================================      ROS  Constitutional: Denies any fever or chills Gastrointestinal: No reported hemesis, hematochezia, vomiting, or acute GI distress Musculoskeletal: Denies any acute onset joint swelling, redness, loss of ROM, or weakness Neurological: No reported episodes of acute onset apraxia, aphasia, dysarthria, agnosia, amnesia, paralysis, loss of coordination, or loss of consciousness  Medication Review  Levothyroxine  Sodium, cholecalciferol, diazepam, esomeprazole, gabapentin, lisinopril, nitroGLYCERIN, pravastatin, and traMADol  History Review  Allergy: Ebony Anderson is allergic to aspirin, codeine, and sulfa antibiotics. Drug: Ebony Anderson  reports no history of drug use. Alcohol:  reports no history of alcohol use. Tobacco:  reports that she has never smoked. She has never used smokeless tobacco. Social: Ebony Anderson  reports that she has never smoked. She has never used smokeless tobacco. She reports that she does not drink alcohol and does not use drugs. Medical:  has a past medical history of Hyperlipidemia, Hypertension, and Thyroid disease. Surgical: Ebony Anderson  has a past surgical history that includes Thyroidectomy; Abdominal hysterectomy; Tonsillectomy; Rhinoplasty; and Abdominoplasty. Family: family history is not on file.  Laboratory Chemistry Profile   Renal No results found for: BUN, CREATININE, LABCREA, BCR, GFR, GFRAA, GFRNONAA, LABVMA, EPIRU, EPINEPH24HUR, NOREPRU, NOREPI24HUR, DOPARU, [id number]   Hepatic No results found for: AST, ALT,  ALBUMIN, ALKPHOS, HCVAB, AMYLASE, LIPASE, AMMONIA   Electrolytes No results found for: NA, K, CL, CALCIUM, MG, PHOS   Bone No results found for: VD25OH, [id number], [id number], [id number], 25OHVITD1, 25OHVITD2, 25OHVITD3, TESTOFREE, TESTOSTERONE   Inflammation (CRP: Acute Phase) (ESR: Chronic Phase) No results found for: CRP, ESRSEDRATE, LATICACIDVEN     Note: Above Lab results reviewed.  Recent Imaging Review  LONG TERM MONITOR (3-14 DAYS) Hevin Jeffcoat, DOB 19-May-1932, MRN [id number]  EVENT MONITOR REPORT:  Patient was monitored from 01/30/2020 through to 02/01/2020. Indication:                    Palpitations Ordering physician:  Jenean Lindau, MD  Referring physician:  Jenean Lindau, MD   Baseline rhythm: Sinus  Minimum heart rate: 44 BPM.  Average heart rate: 71 BPM.  Maximal heart  rate 108 BPM.  Atrial arrhythmia: Sinus with  PACs, rare brief atrial runs   Ventricular arrhythmia: PVCs seen  Conduction abnormality: None significant  Symptoms: None significant  Conclusion:  Unremarkable event monitor  Interpreting  cardiologist: Jenean Lindau, MD  Date: 02/24/2020 11:07 AM Note: Reviewed        Physical Exam  General appearance: Well nourished, well developed, and well hydrated. In no apparent acute distress Mental status: Alert, oriented x 3 (person, place, & time)       Respiratory: No evidence of acute respiratory distress Eyes: PERLA Vitals: BP (!) 157/75   Pulse 73   Temp 97.6 F (36.4 C)   Resp 18   Ht '5\' 3"'  (1.6 m)   Wt 139 lb (63 kg)   SpO2 100%   BMI 24.62 kg/m  BMI: Estimated body mass index is 24.62 kg/m as calculated from the following:   Height as of this encounter: '5\' 3"'  (1.6 m).   Weight as of this encounter: 139 lb (63 kg). Ideal: Ideal body weight: 52.4 kg (115 lb 8.3 oz) Adjusted ideal body weight: 56.7 kg (124 lb 14.6 oz)  Assessment   Status Diagnosis  Controlled Controlled Controlled 1. Lumbar facet arthropathy   2. Lumbar degenerative disc disease   3. Cervical facet joint syndrome   4. Lumbar spondylosis   5. Cervical radicular pain   6. Localized osteoarthritis of shoulder regions, bilateral   7. Chronic pain syndrome       Plan of Care  Ebony Anderson has a current medication list which includes the following long-term medication(s): esomeprazole, lisinopril, pravastatin, gabapentin, and nitroglycerin.  Pharmacotherapy (Medications Ordered): Meds ordered this encounter  Medications  . traMADol (ULTRAM) 50 MG tablet    Sig: Take 1 tablet (50 mg total) by mouth daily as needed.    Dispense:  30 tablet    Refill:  0  . gabapentin (NEURONTIN) 300 MG capsule    Sig: Take 2 capsules (600 mg total) by mouth at bedtime.    Dispense:  60 capsule    Refill:  2   Follow-up plan:   Return in about 10 weeks (around 07/10/2020) for Medication Management, in  person.     Status post diagnostic L3, L4, L5 facet medial branch nerve block 09/26/2019: Not effective, consider sprint peripheral nerve stimulation of medial branch patient provided resources.       Recent Visits Date Type Provider Dept  02/02/20 Office Visit Gillis Santa, MD [facility]  Showing recent visits within past 90 days and meeting all other requirements Today's Visits Date Type Provider Dept  05/01/20 Office Visit Holley Raring,  Carlus Pavlov, MD [facility]  Showing today's visits and meeting all other requirements Future Appointments No visits were found meeting these conditions. Showing future appointments within next 90 days and meeting all other requirements  I discussed the assessment and treatment plan with the patient. The patient was provided an opportunity to ask questions and all were answered. The patient agreed with the plan and demonstrated an understanding of the instructions.  Patient advised to call back or seek an in-person evaluation if the symptoms or condition worsens.  Duration of encounter: 30 minutes.  Note by: Gillis Santa, MD Date: 05/01/2020; Time: 1:33 PM

## 2020-05-09 ENCOUNTER — Telehealth: Payer: Self-pay

## 2020-05-09 NOTE — Telephone Encounter (Signed)
Patient states the 600mg  at bedtime is making her feel sluggish and clumsy if she has to get up during the night. She is currently not taking a daytime dose. I suggested she go down to 1 capsule or 300mg  instead of 600 at bedtime and see if that helped her symptoms. She is to call next week and let know how that dosing is going. If needed we can set her up for a VV with Dr. [place], if she continues to have problems. Her next visit with [place] is in March 2022.

## 2020-05-09 NOTE — Telephone Encounter (Signed)
She is confused on how to take her gabapentin. Dr. Cherylann Ratel told her to take one in the am and at night. The bottle tells her to take 2 at bedtime. Please call her to clarify.

## 2020-05-22 ENCOUNTER — Ambulatory Visit: Payer: Medicare PPO | Admitting: Cardiology

## 2020-07-05 ENCOUNTER — Encounter: Payer: Medicare PPO | Admitting: Student in an Organized Health Care Education/Training Program

## 2020-08-02 ENCOUNTER — Encounter: Payer: Medicare PPO | Admitting: Student in an Organized Health Care Education/Training Program

## 2021-10-25 IMAGING — CR DG LUMBAR SPINE COMPLETE 4+V
1 series · 5 of 5 positions shown · non-contrast
Comparison: None.

CLINICAL DATA: Low back pain and sciatica

EXAM:
LUMBAR SPINE - COMPLETE 4+ VIEW

[Series 1: w lumbar spine ap · 0.14mm/px · 5 of 5 slices shown]
[im 1/5]
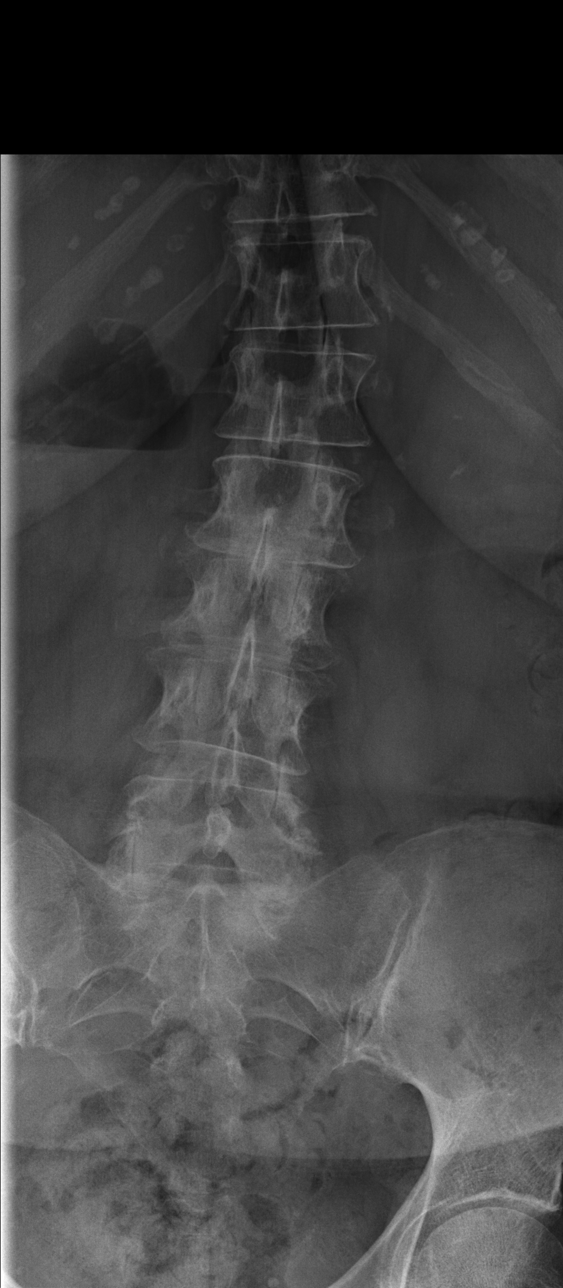
[im 2/5]
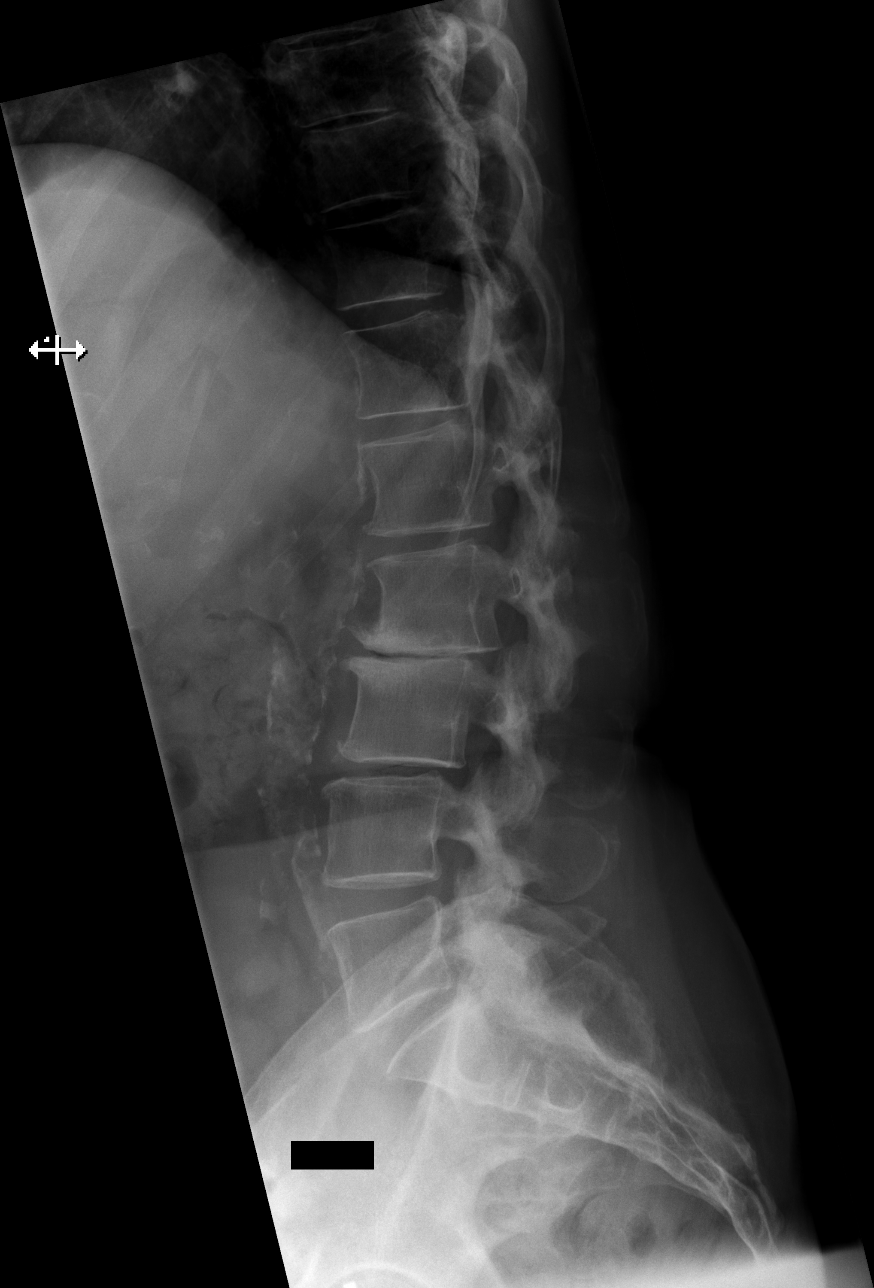
[im 3/5]
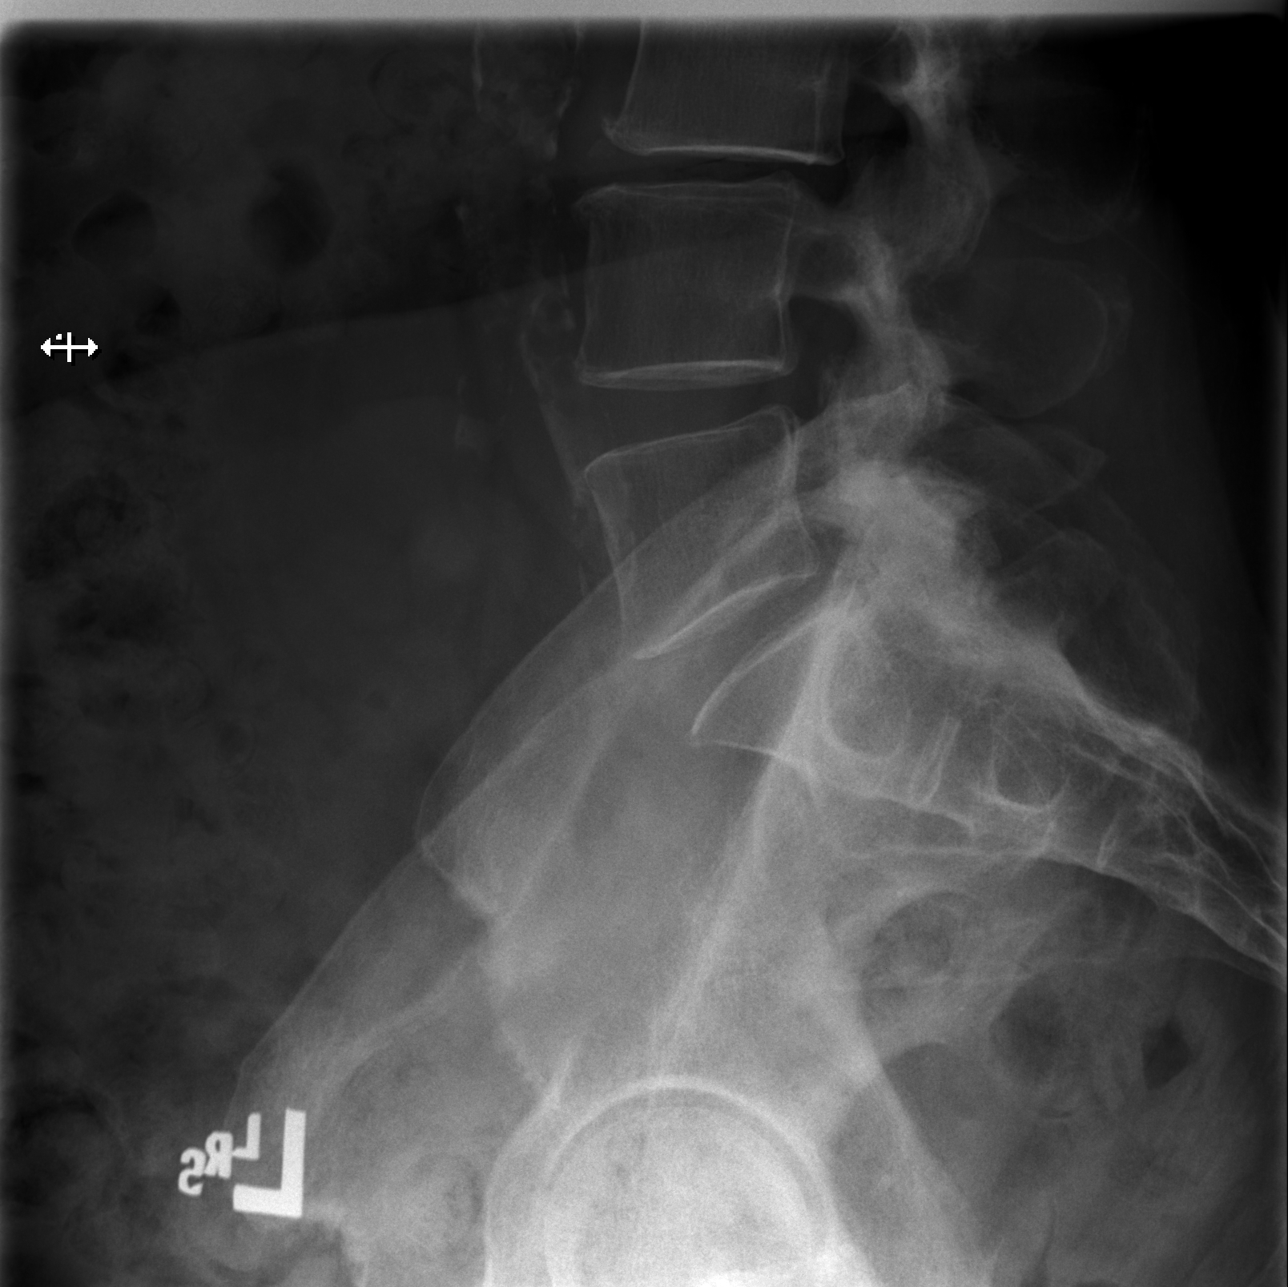
[im 4/5]
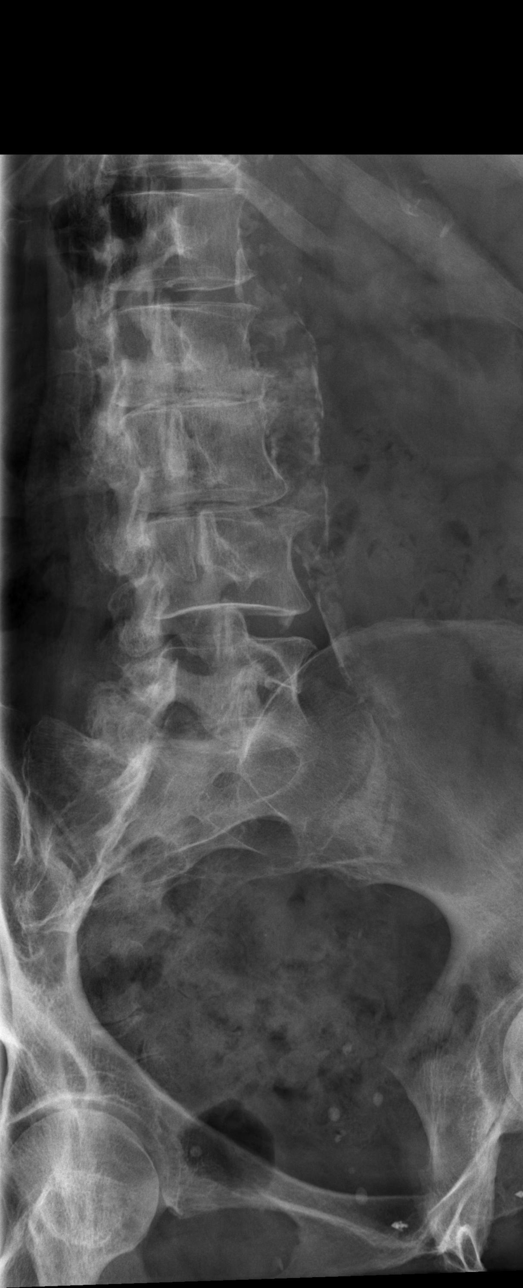
[im 5/5]
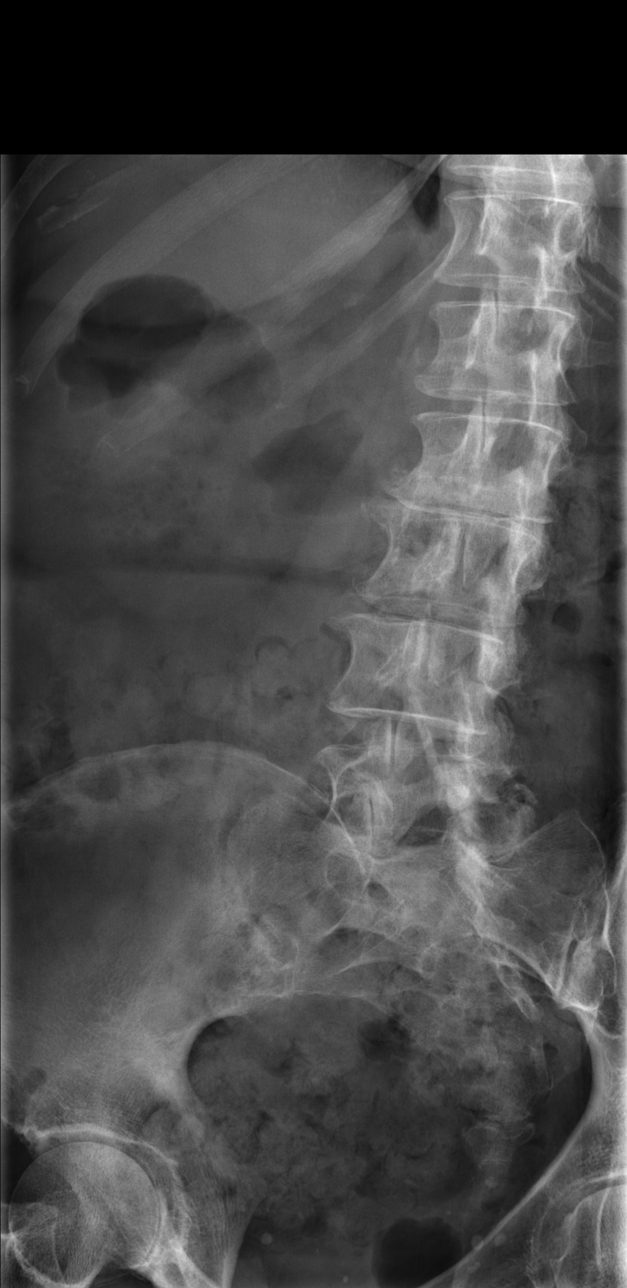

[5 of 5 positions shown; findings below may reference images not displayed]

FINDINGS: Mild curvature of the thoracolumbar spine convex left centered at
the thoracolumbar junction. Mild spondylosis throughout the lumbar
spine most notable at the L2-3 level. Moderate facet arthropathy
over the lower lumbar spine. No evidence of compression fracture or
subluxation. Moderate disc space narrowing at the L2-3 level and to
lesser extent at the L3-4 level. Mild calcified plaque over the
abdominal aorta and iliac arteries.
IMPRESSION: Mild spondylosis of the lumbar spine to include moderate facet
arthropathy over the lower lumbar spine. Moderate disc space
narrowing at the L2-3 level and to lesser extent at the L3-4 level.

## 2021-11-15 IMAGING — CR DG SHOULDER 2+V*R*
1 series · 3 of 3 positions shown · non-contrast
Comparison: No prior.

CLINICAL DATA: Right shoulder pain.

EXAM:
RIGHT SHOULDER - 2+ VIEW

[Series 1: dg shoulder right · 0.14mm/px · 3 of 3 slices shown]
[im 1/3]
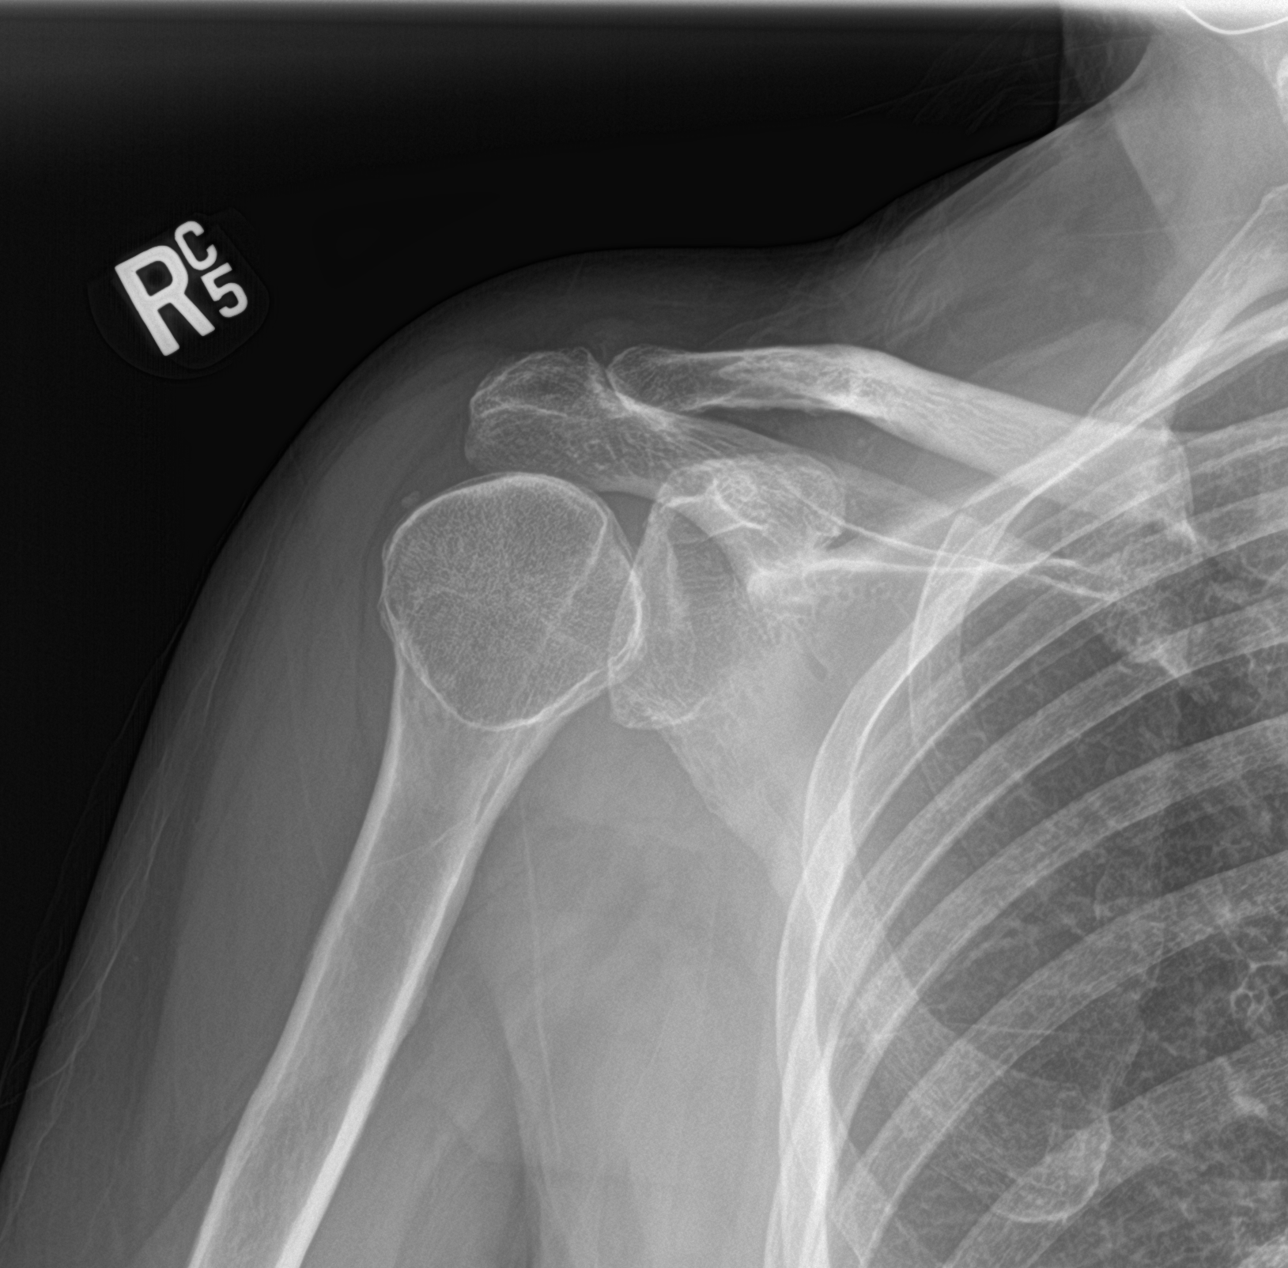
[im 2/3]
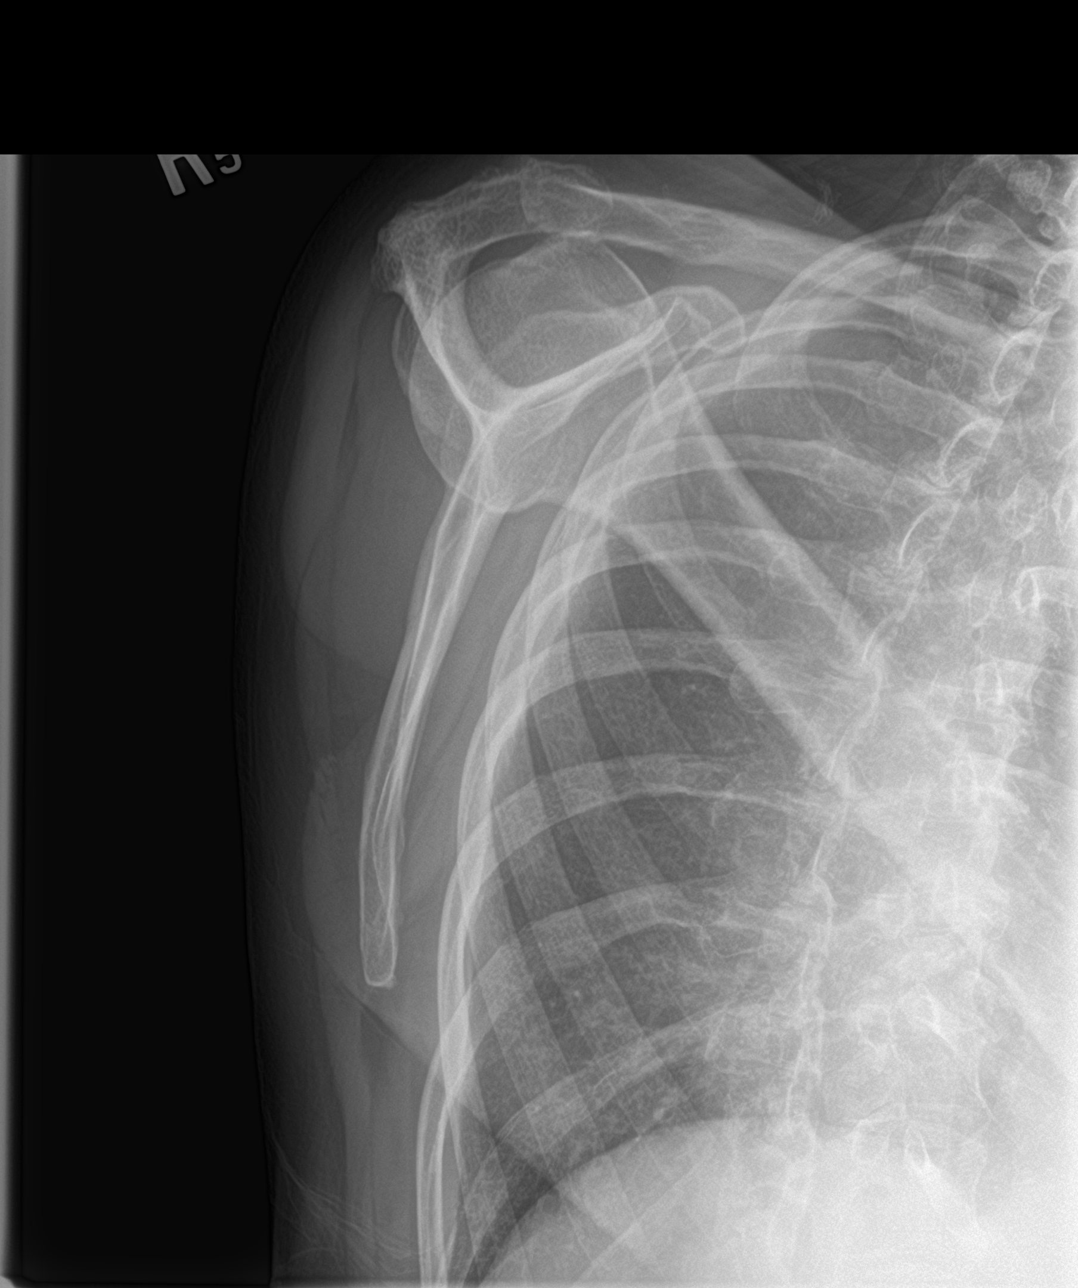
[im 3/3]
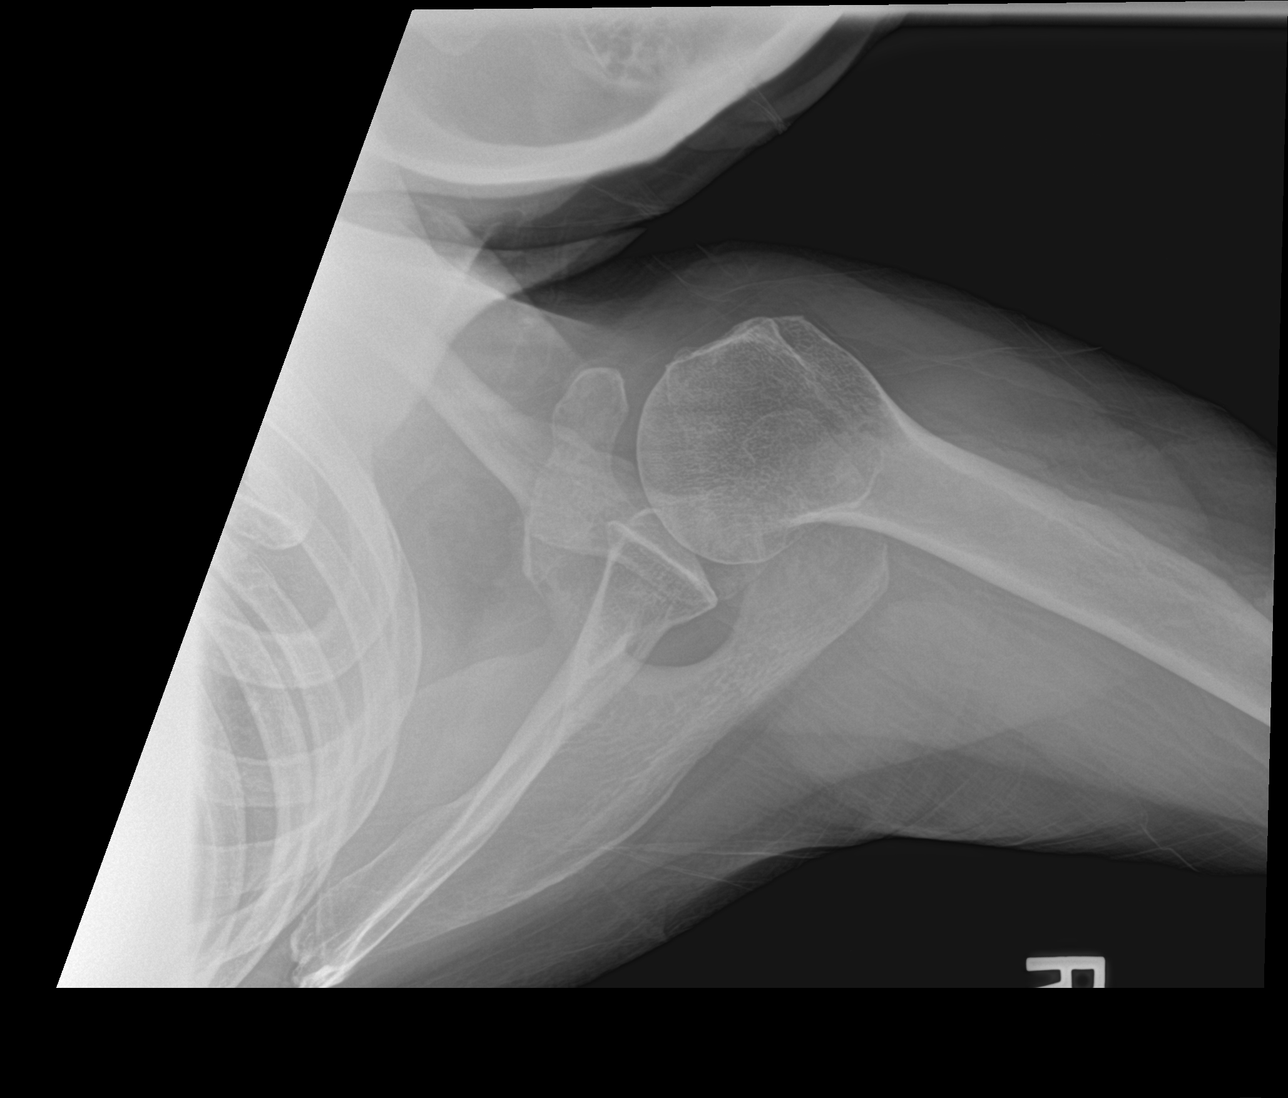

[3 of 3 positions shown; findings below may reference images not displayed]

FINDINGS: Acromioclavicular glenohumeral degenerative change. Calcification in
the region supraspinatus tendon consistent calcific supraspinatus
tendinosis. No acute abnormality identified. No evidence of
fracture, dislocation, or separation.
IMPRESSION: 1.  Acromioclavicular glenohumeral degenerative change.

2. Calcific changes in the supraspinatus tendon consistent calcific
supraspinatus tendinosis.

3.  No acute bony abnormality identified.

## 2021-11-29 ENCOUNTER — Other Ambulatory Visit: Payer: Self-pay

## 2021-11-29 ENCOUNTER — Emergency Department: Payer: Medicare PPO

## 2021-11-29 ENCOUNTER — Emergency Department
Admission: EM | Admit: 2021-11-29 | Discharge: 2021-11-29 | Disposition: A | Discharge status: Home / Self Care | Payer: Medicare PPO | Attending: Emergency Medicine | Admitting: Emergency Medicine

## 2021-11-29 DIAGNOSIS — R0789 Other chest pain: Secondary | ICD-10-CM | POA: Diagnosis not present

## 2021-11-29 DIAGNOSIS — W010XXA Fall on same level from slipping, tripping and stumbling without subsequent striking against object, initial encounter: Secondary | ICD-10-CM | POA: Insufficient documentation

## 2021-11-29 DIAGNOSIS — M25571 Pain in right ankle and joints of right foot: Secondary | ICD-10-CM | POA: Insufficient documentation

## 2021-11-29 DIAGNOSIS — R519 Headache, unspecified: Secondary | ICD-10-CM | POA: Insufficient documentation

## 2021-11-29 DIAGNOSIS — S8991XA Unspecified injury of right lower leg, initial encounter: Secondary | ICD-10-CM | POA: Diagnosis present

## 2021-11-29 DIAGNOSIS — Z23 Encounter for immunization: Secondary | ICD-10-CM | POA: Diagnosis not present

## 2021-11-29 DIAGNOSIS — W19XXXA Unspecified fall, initial encounter: Secondary | ICD-10-CM

## 2021-11-29 DIAGNOSIS — S80811A Abrasion, right lower leg, initial encounter: Secondary | ICD-10-CM | POA: Diagnosis not present

## 2021-11-29 DIAGNOSIS — T07XXXA Unspecified multiple injuries, initial encounter: Secondary | ICD-10-CM

## 2021-11-29 MED ORDER — TETANUS-DIPHTH-ACELL PERTUSSIS 5-2.5-18.5 LF-MCG/0.5 IM SUSY
0.5000 mL | PREFILLED_SYRINGE | Freq: Once | INTRAMUSCULAR | Status: AC
  Administered 2021-11-29: 0.5 mL via INTRAMUSCULAR
  Filled 2021-11-29: qty 0.5

## 2021-11-29 NOTE — ED Provider Notes (Signed)
[facility] Provider Note  Patient Contact: 6:01 PM (approximate)   History   Fall   HPI  Ebony Anderson is a 88 y.o. female who presents to the emergency department complaining of headache, chest wall pain, ankle pain after a mechanical fall.  She states that she had her purse on the floor, as she got up from the couch her right leg went into the strap of the purse, she took a step with the left leg the foot landed on her purse causing her to stumble, with the right ankle wrapped in the purse strap it caused her to fall.  Patient did hit her head but not sustaining any loss of consciousness.  She denies any visual changes, unilateral weakness, slurred speech.  Patient has pain along the left chest wall but has no describe substernal chest pain or shortness of breath.  No abdominal pain.  Patient has no pain in her lower back or hips at this time.  She did sustain an abrasion to the right shin and is unsure when her last tetanus shot was.     Physical Exam   Triage Vital Signs: ED Triage Vitals [11/29/21 1416]  Enc Vitals Group     BP 138/65     Pulse Rate 81     Resp 16     Temp 98.4 F (36.9 C)     Temp Source Oral     SpO2 98 %     Weight 134 lb (60.8 kg)     Height 5\' 5"  (1.651 m)     Head Circumference      Peak Flow      Pain Score 5     Pain Loc      Pain Edu?      Excl. in GC?     Most recent vital signs: Vitals:   11/29/21 1416  BP: 138/65  Pulse: 81  Resp: 16  Temp: 98.4 F (36.9 C)  SpO2: 98%     General: Alert and in no acute distress. Eyes:  PERRL. EOMI. Head: No acute traumatic findings  Neck: No stridor. No cervical spine tenderness to palpation.  Cardiovascular:  Good peripheral perfusion.  Normal S1 and S2 with no appreciable murmurs, rubs, gallops.  No appreciable muffled heart sounds. Respiratory: Normal respiratory effort without tachypnea or retractions. Lungs CTAB. Good air entry to the bases with no decreased or  absent breath sounds Musculoskeletal: Full range of motion to all extremities.  Superficial abrasion noted to the right shin with no active bleeding, for visible foreign body.  Patient arrives with a bandage over top.  Examination left ankle reveals no deformity, edema, ecchymosis, open wounds.  Good range of motion patient is able to bear weight on the ankle joint at this time. Neurologic:  No gross focal neurologic deficits are appreciated.  Skin:   No rash noted Other:   ED Results / Procedures / Treatments   Labs (all labs ordered are listed, but only abnormal results are displayed) Labs Reviewed - No data to display   EKG  ED ECG REPORT I, 12/01/21 Kaenan Jake,  personally viewed and interpreted this ECG.   Date: Since 11/29/2021  EKG Time: 1826 hrs.  Rate: 59 bpm  Rhythm: unchanged from previous tracings, sinus bradycardia  Axis: Leftward axis  Intervals:none  ST&T Change: No gross ST elevation or depression noted  Sinus bradycardia, no STEMI.    RADIOLOGY  I personally viewed, evaluated, and interpreted these images as  part of my medical decision making, as well as reviewing the written report by the radiologist.  ED Provider Interpretation: No acute traumatic findings to the head, cervical spine on CT, no fracture to the left ankle or evidence of rib fracture or underlying cardiopulmonary abnormality on chest x-ray.  CT HEAD WO CONTRAST ( )  Result Date: 11/29/2021 CLINICAL DATA:  Head trauma, minor (Age >= 65y); Neck trauma (Age >= 65y). Patient fell yesterday. Hit right side of head and breast. Patient is having shooting pain across top of head. EXAM: CT HEAD WITHOUT CONTRAST CT CERVICAL SPINE WITHOUT CONTRAST TECHNIQUE: Multidetector CT imaging of the head and cervical spine was performed following the standard protocol without intravenous contrast. Multiplanar CT image reconstructions of the cervical spine were also generated. RADIATION DOSE REDUCTION: This exam  was performed according to the departmental dose-optimization program which includes automated exposure control, adjustment of the mA and/or kV according to patient size and/or use of iterative reconstruction technique. COMPARISON:  None Available. FINDINGS: CT HEAD FINDINGS Brain: No evidence of large-territorial acute infarction. No parenchymal hemorrhage. No mass lesion. No extra-axial collection. No mass effect or midline shift. No hydrocephalus. Basilar cisterns are patent. Vascular: No hyperdense vessel. Skull: No acute fracture or focal lesion. Sinuses/Orbits: Paranasal sinuses and mastoid air cells are clear. Bilateral lens replacement. Otherwise the orbits are unremarkable. Other: None. CT CERVICAL SPINE FINDINGS Alignment: Grade 1 anterolisthesis of C2 on C3, C3 on C4, C4 on C5, C7 on T1. Skull base and vertebrae: Multilevel degenerative changes spine most prominent at the C5 through C7 levels. No acute fracture. No aggressive appearing focal osseous lesion or focal pathologic process. Soft tissues and spinal canal: No prevertebral fluid or swelling. No visible canal hematoma. Upper chest: Unremarkable. Other: Thyroid surgical changes with vascular clips. IMPRESSION: 1. No acute intracranial abnormality. 2. No acute displaced fracture or traumatic listhesis of the cervical spine. Electronically Signed   By: Tish Frederickson M.D.   On: 11/29/2021 16:21   CT Cervical Spine Wo Contrast  Result Date: 11/29/2021 CLINICAL DATA:  Head trauma, minor (Age >= 65y); Neck trauma (Age >= 65y). Patient fell yesterday. Hit right side of head and breast. Patient is having shooting pain across top of head. EXAM: CT HEAD WITHOUT CONTRAST CT CERVICAL SPINE WITHOUT CONTRAST TECHNIQUE: Multidetector CT imaging of the head and cervical spine was performed following the standard protocol without intravenous contrast. Multiplanar CT image reconstructions of the cervical spine were also generated. RADIATION DOSE REDUCTION: This  exam was performed according to the departmental dose-optimization program which includes automated exposure control, adjustment of the mA and/or kV according to patient size and/or use of iterative reconstruction technique. COMPARISON:  None Available. FINDINGS: CT HEAD FINDINGS Brain: No evidence of large-territorial acute infarction. No parenchymal hemorrhage. No mass lesion. No extra-axial collection. No mass effect or midline shift. No hydrocephalus. Basilar cisterns are patent. Vascular: No hyperdense vessel. Skull: No acute fracture or focal lesion. Sinuses/Orbits: Paranasal sinuses and mastoid air cells are clear. Bilateral lens replacement. Otherwise the orbits are unremarkable. Other: None. CT CERVICAL SPINE FINDINGS Alignment: Grade 1 anterolisthesis of C2 on C3, C3 on C4, C4 on C5, C7 on T1. Skull base and vertebrae: Multilevel degenerative changes spine most prominent at the C5 through C7 levels. No acute fracture. No aggressive appearing focal osseous lesion or focal pathologic process. Soft tissues and spinal canal: No prevertebral fluid or swelling. No visible canal hematoma. Upper chest: Unremarkable. Other: Thyroid surgical changes with vascular clips. IMPRESSION: 1.  No acute intracranial abnormality. 2. No acute displaced fracture or traumatic listhesis of the cervical spine. Electronically Signed   By: Tish Frederickson M.D.   On: 11/29/2021 16:21   DG Ankle Complete Left  Result Date: 11/29/2021 CLINICAL DATA:  Left ankle pain after fall. EXAM: LEFT ANKLE COMPLETE - 3+ VIEW COMPARISON:  None Available. FINDINGS: There is no evidence of fracture, dislocation, or joint effusion. There is no evidence of arthropathy or other focal bone abnormality. Soft tissues are unremarkable. IMPRESSION: Negative. Electronically Signed   By: Lupita Raider M.D.   On: 11/29/2021 15:40   DG Chest 2 View  Result Date: 11/29/2021 CLINICAL DATA:  Fall. EXAM: CHEST - 2 VIEW COMPARISON:  None Available. FINDINGS:  The heart size and mediastinal contours are within normal limits. Both lungs are clear. The visualized skeletal structures are unremarkable. IMPRESSION: No active cardiopulmonary disease. Electronically Signed   By: Lupita Raider M.D.   On: 11/29/2021 15:38    PROCEDURES:  Critical Care performed: No  Procedures   MEDICATIONS ORDERED IN ED: Medications  Tdap (BOOSTRIX) injection 0.5 mL (0.5 mLs Intramuscular Given 11/29/21 1812)     IMPRESSION / MDM / ASSESSMENT AND PLAN / ED COURSE  I reviewed the triage vital signs and the nursing notes.                              Differential diagnosis includes, but is not limited to, fall, skull fracture, intracranial hemorrhage, rib fracture, sternal fracture, ankle fracture, abrasion  Patient's presentation is most consistent with acute presentation with potential threat to life or bodily function.   Patient's diagnosis is consistent with fall, multiple contusions.  Patient presents to the ED after having a mechanical fall yesterday.  Her feet got caught up on her purse causing her to fall.  She did hit her head but did not lose consciousness.  She was neurologically intact on exam.  Imaging was reassuring with no acute traumatic findings.  Patient was very concerned about her heart and that the fall could have "because my heart to be funny."  Patient had an EKG which is reassuring.  She has no substernal chest pain to warrant further investigation with labs.  At this time patient is stable for discharge.  Tetanus shot updated.  Follow-up primary care as needed. Patient is given ED precautions to return to the ED for any worsening or new symptoms.        FINAL CLINICAL IMPRESSION(S) / ED DIAGNOSES   Final diagnoses:  Fall, initial encounter  Multiple contusions     Rx / DC Orders   ED Discharge Orders     None        Note:  This document was prepared using Dragon voice recognition software and may include unintentional  dictation errors.   Racheal Patches, PA-C 11/29/21 1833    Gilles Chiquito, MD 11/29/21 1901

## 2021-11-29 NOTE — ED Triage Notes (Signed)
Pt states that she tripped and fell yesterday, hit her head and landed on her breast, pt is concerned about getting xrays and would rather have an mri

## 2021-11-29 NOTE — ED Provider Triage Note (Signed)
Emergency Medicine Provider Triage Evaluation Note  Ebony Anderson , a 88 y.o. female  was evaluated in triage.  Pt complains of fall yesterday.  [place] and hit her head.  Having pain at her breast.  Left ankle pain she feels like she sprained it..  Review of Systems  Positive: Fall Negative: LOC  Physical Exam  There were no vitals taken for this visit. Gen:   Awake, no distress   Resp:  Normal effort  MSK:   Moves extremities without difficulty  Other:    Medical Decision Making  Medically screening exam initiated at 2:15 PM.  Appropriate orders placed.  Ebony Anderson was informed that the remainder of the evaluation will be completed by another provider, this initial triage assessment does not replace that evaluation, and the importance of remaining in the ED until their evaluation is complete.  CT is ordered.  Patient's been very argumentative on whether to get a CT.  She wants MRIs of her entire body.  I told her that would not happen as we do MRI spine appointment unless we feel that you are having a stroke.   Ebony Ghee, PA-C 11/29/21 1416

## 2022-02-26 ENCOUNTER — Other Ambulatory Visit: Payer: Self-pay

## 2022-02-26 ENCOUNTER — Emergency Department: Payer: Medicare PPO

## 2022-02-26 ENCOUNTER — Emergency Department
Admission: EM | Admit: 2022-02-26 | Discharge: 2022-02-26 | Disposition: A | Discharge status: Home / Self Care | Payer: Medicare PPO | Attending: Emergency Medicine | Admitting: Emergency Medicine

## 2022-02-26 DIAGNOSIS — Y990 Civilian activity done for income or pay: Secondary | ICD-10-CM | POA: Diagnosis not present

## 2022-02-26 DIAGNOSIS — W01198A Fall on same level from slipping, tripping and stumbling with subsequent striking against other object, initial encounter: Secondary | ICD-10-CM | POA: Insufficient documentation

## 2022-02-26 DIAGNOSIS — S8992XA Unspecified injury of left lower leg, initial encounter: Secondary | ICD-10-CM | POA: Diagnosis present

## 2022-02-26 DIAGNOSIS — S8002XA Contusion of left knee, initial encounter: Secondary | ICD-10-CM

## 2022-02-26 NOTE — Discharge Instructions (Signed)
You should follow the acronym RICE: Rest the leg Apply ice Compress (with the Ace bandage) Elevate  Keep the knee immobilizer on when walking or bearing weight over the next several days.  You may take it off when you are at rest.  Once you start to feel comfortable bearing weight on the leg you can stop using the immobilizer.  Follow-up with orthopedist in 1 to 2 weeks if you are having any persistent pain.  You may take ibuprofen or Tylenol as needed for the pain.  Return to the ER for new, worsening, or persistent severe pain, weakness or numbness, increased swelling or discoloration, or any other new or worsening symptoms that concern you.

## 2022-02-26 NOTE — ED Provider Notes (Signed)
[facility] Provider Note    Event Date/Time   First MD Initiated Contact with Patient 02/26/22 1827     (approximate)   History   Knee Injury   HPI  Ebony Anderson is a 88 y.o. female who presents with left knee pain since yesterday.  The patient states that she was doing some work in a [employer] when she fell and directly hit the knee on the ground.  She denies any other injuries.  She states that yesterday she put some ice on it and try to keep it elevated, however she also attempted to do physical therapy exercises.  Today she states that the knee was more painful and this morning the lower leg was somewhat discolored although this has resolved.  She denies any other injuries.  She had no twisting or abnormal flexing of the knee.  She has no weakness or numbness.    Physical Exam   Triage Vital Signs: ED Triage Vitals [02/26/22 1528]  Enc Vitals Group     BP (!) 131/57     Pulse Rate 67     Resp 18     Temp 98.3 F (36.8 C)     Temp src      SpO2 98 %     Weight      Height      Head Circumference      Peak Flow      Pain Score 10     Pain Loc      Pain Edu?      Excl. in [place]?     Most recent vital signs: Vitals:   02/26/22 1528 02/26/22 1833  BP: (!) 131/57 (!) 158/63  Pulse: 67 66  Resp: 18 16  Temp: 98.3 F (36.8 C)   SpO2: 98% 97%     General: Awake, no distress.  CV:  Good peripheral perfusion.  Resp:  Normal effort.  Abd:  No distention.  Other:  Left knee with minimal tenderness posteriorly.  No deformity.  No laxity.  Normal range of motion with some mild pain.  Extensor mechanism intact.  No discoloration.  No distal leg swelling or bruising.  2+ DP pulse.  Normal cap refill distally.  Motor and sensory intact.   ED Results / Procedures / Treatments   Labs (all labs ordered are listed, but only abnormal results are displayed) Labs Reviewed - No data to display   EKG     RADIOLOGY  XR L knee: I independently  viewed and interpreted the images; there is osteoarthritis with no acute fracture  PROCEDURES:  Critical Care performed: No  Procedures   MEDICATIONS ORDERED IN ED: Medications - No data to display   IMPRESSION / MDM / [place] / ED COURSE  I reviewed the triage vital signs and the nursing notes.  88 year old female presents with left knee pain since yesterday after a direct blow.  She has had some difficulty bearing weight today although is able to bear weight.  Physical exam is overall reassuring with no deformity.  The lower leg is neuro/vascular intact.  Range of motion is good and the extensor mechanism is intact.  X-rays were obtained from triage and show no acute fracture.  The patient has osteoarthritis.  Differential diagnosis includes, but is not limited to, knee contusion, sprain.  There is no evidence of fracture or significant ligamentous injury.  Patient's presentation is most consistent with acute complicated illness / injury requiring diagnostic workup.  At this time, the patient is stable for discharge.  She has no other injuries requiring work-up.  I have ordered a knee immobilizer for stability and support, and Ace bandage, and have counseled the patient on RICE instructions.  I have given orthopedic referral.  I answered all the patient's questions.  I gave her strict return precautions and she expressed understanding.   FINAL CLINICAL IMPRESSION(S) / ED DIAGNOSES   Final diagnoses:  Contusion of left knee, initial encounter     Rx / DC Orders   ED Discharge Orders     None        Note:  This document was prepared using Dragon voice recognition software and may include unintentional dictation errors.    Dionne Bucy, MD 02/26/22 2358

## 2022-02-26 NOTE — ED Triage Notes (Signed)
Pt comes with c/o left knee pain. Pt states she fell onto the ottoman and hit it hard. Pt states she can barely bend it and can't stand on it.

## 2022-12-12 DIAGNOSIS — M47816 Spondylosis without myelopathy or radiculopathy, lumbar region: Secondary | ICD-10-CM | POA: Diagnosis not present

## 2022-12-12 DIAGNOSIS — M5137 Other intervertebral disc degeneration, lumbosacral region: Secondary | ICD-10-CM | POA: Diagnosis not present

## 2022-12-12 DIAGNOSIS — M5126 Other intervertebral disc displacement, lumbar region: Secondary | ICD-10-CM | POA: Diagnosis not present

## 2022-12-12 DIAGNOSIS — M48061 Spinal stenosis, lumbar region without neurogenic claudication: Secondary | ICD-10-CM | POA: Diagnosis not present

## 2023-01-14 DIAGNOSIS — Z6824 Body mass index (BMI) 24.0-24.9, adult: Secondary | ICD-10-CM | POA: Diagnosis not present

## 2023-01-14 DIAGNOSIS — L304 Erythema intertrigo: Secondary | ICD-10-CM | POA: Diagnosis not present

## 2023-01-14 DIAGNOSIS — K5900 Constipation, unspecified: Secondary | ICD-10-CM | POA: Diagnosis not present

## 2023-01-26 DIAGNOSIS — M545 Low back pain, unspecified: Secondary | ICD-10-CM | POA: Diagnosis not present

## 2023-01-26 DIAGNOSIS — Z6824 Body mass index (BMI) 24.0-24.9, adult: Secondary | ICD-10-CM | POA: Diagnosis not present

## 2023-02-12 DIAGNOSIS — Z23 Encounter for immunization: Secondary | ICD-10-CM | POA: Diagnosis not present

## 2023-02-12 DIAGNOSIS — E039 Hypothyroidism, unspecified: Secondary | ICD-10-CM | POA: Diagnosis not present

## 2023-02-25 DIAGNOSIS — H168 Other keratitis: Secondary | ICD-10-CM | POA: Diagnosis not present

## 2023-03-10 DIAGNOSIS — H168 Other keratitis: Secondary | ICD-10-CM | POA: Diagnosis not present

## 2023-06-04 DIAGNOSIS — I10 Essential (primary) hypertension: Secondary | ICD-10-CM | POA: Diagnosis not present

## 2023-06-04 DIAGNOSIS — Z6825 Body mass index (BMI) 25.0-25.9, adult: Secondary | ICD-10-CM | POA: Diagnosis not present

## 2023-06-04 DIAGNOSIS — K921 Melena: Secondary | ICD-10-CM | POA: Diagnosis not present

## 2023-06-04 DIAGNOSIS — E039 Hypothyroidism, unspecified: Secondary | ICD-10-CM | POA: Diagnosis not present

## 2023-06-26 DIAGNOSIS — M48062 Spinal stenosis, lumbar region with neurogenic claudication: Secondary | ICD-10-CM | POA: Diagnosis not present

## 2023-07-08 DIAGNOSIS — Z6825 Body mass index (BMI) 25.0-25.9, adult: Secondary | ICD-10-CM | POA: Diagnosis not present

## 2023-07-08 DIAGNOSIS — K5900 Constipation, unspecified: Secondary | ICD-10-CM | POA: Diagnosis not present

## 2023-07-08 DIAGNOSIS — M545 Low back pain, unspecified: Secondary | ICD-10-CM | POA: Diagnosis not present

## 2023-07-08 DIAGNOSIS — Z79899 Other long term (current) drug therapy: Secondary | ICD-10-CM | POA: Diagnosis not present

## 2023-07-08 DIAGNOSIS — E039 Hypothyroidism, unspecified: Secondary | ICD-10-CM | POA: Diagnosis not present

## 2023-08-20 DIAGNOSIS — G894 Chronic pain syndrome: Secondary | ICD-10-CM | POA: Diagnosis not present

## 2023-08-20 DIAGNOSIS — M47816 Spondylosis without myelopathy or radiculopathy, lumbar region: Secondary | ICD-10-CM | POA: Diagnosis not present

## 2023-08-20 DIAGNOSIS — M48061 Spinal stenosis, lumbar region without neurogenic claudication: Secondary | ICD-10-CM | POA: Diagnosis not present

## 2023-08-20 DIAGNOSIS — K5909 Other constipation: Secondary | ICD-10-CM | POA: Diagnosis not present

## 2023-08-20 DIAGNOSIS — Z1389 Encounter for screening for other disorder: Secondary | ICD-10-CM | POA: Diagnosis not present

## 2023-10-01 DIAGNOSIS — K610 Anal abscess: Secondary | ICD-10-CM | POA: Diagnosis not present

## 2023-10-01 DIAGNOSIS — Z79891 Long term (current) use of opiate analgesic: Secondary | ICD-10-CM | POA: Diagnosis not present

## 2023-10-07 DIAGNOSIS — M069 Rheumatoid arthritis, unspecified: Secondary | ICD-10-CM | POA: Diagnosis not present

## 2023-10-07 DIAGNOSIS — I491 Atrial premature depolarization: Secondary | ICD-10-CM | POA: Diagnosis not present

## 2023-10-07 DIAGNOSIS — R748 Abnormal levels of other serum enzymes: Secondary | ICD-10-CM | POA: Diagnosis not present

## 2023-10-07 DIAGNOSIS — R21 Rash and other nonspecific skin eruption: Secondary | ICD-10-CM | POA: Diagnosis not present

## 2023-10-07 DIAGNOSIS — K219 Gastro-esophageal reflux disease without esophagitis: Secondary | ICD-10-CM | POA: Diagnosis not present

## 2023-10-07 DIAGNOSIS — K602 Anal fissure, unspecified: Secondary | ICD-10-CM | POA: Diagnosis not present

## 2023-10-07 DIAGNOSIS — I10 Essential (primary) hypertension: Secondary | ICD-10-CM | POA: Diagnosis not present

## 2023-10-07 DIAGNOSIS — M1990 Unspecified osteoarthritis, unspecified site: Secondary | ICD-10-CM | POA: Diagnosis not present

## 2023-10-07 DIAGNOSIS — E785 Hyperlipidemia, unspecified: Secondary | ICD-10-CM | POA: Diagnosis not present

## 2023-10-08 DIAGNOSIS — I10 Essential (primary) hypertension: Secondary | ICD-10-CM | POA: Diagnosis not present

## 2023-10-13 DIAGNOSIS — K5730 Diverticulosis of large intestine without perforation or abscess without bleeding: Secondary | ICD-10-CM | POA: Diagnosis not present

## 2023-10-13 DIAGNOSIS — R109 Unspecified abdominal pain: Secondary | ICD-10-CM | POA: Diagnosis not present

## 2023-10-13 DIAGNOSIS — Z6824 Body mass index (BMI) 24.0-24.9, adult: Secondary | ICD-10-CM | POA: Diagnosis not present

## 2023-10-13 DIAGNOSIS — R748 Abnormal levels of other serum enzymes: Secondary | ICD-10-CM | POA: Diagnosis not present

## 2023-10-13 DIAGNOSIS — K5900 Constipation, unspecified: Secondary | ICD-10-CM | POA: Diagnosis not present

## 2023-10-13 DIAGNOSIS — J479 Bronchiectasis, uncomplicated: Secondary | ICD-10-CM | POA: Diagnosis not present

## 2023-12-14 DIAGNOSIS — M19042 Primary osteoarthritis, left hand: Secondary | ICD-10-CM | POA: Diagnosis not present

## 2024-01-12 DIAGNOSIS — M65331 Trigger finger, right middle finger: Secondary | ICD-10-CM | POA: Diagnosis not present

## 2024-02-04 DIAGNOSIS — R634 Abnormal weight loss: Secondary | ICD-10-CM | POA: Diagnosis not present

## 2024-02-04 DIAGNOSIS — E782 Mixed hyperlipidemia: Secondary | ICD-10-CM | POA: Diagnosis not present

## 2024-02-04 DIAGNOSIS — G894 Chronic pain syndrome: Secondary | ICD-10-CM | POA: Diagnosis not present

## 2024-02-04 DIAGNOSIS — K5909 Other constipation: Secondary | ICD-10-CM | POA: Diagnosis not present

## 2024-02-04 DIAGNOSIS — I10 Essential (primary) hypertension: Secondary | ICD-10-CM | POA: Diagnosis not present

## 2024-02-04 DIAGNOSIS — I493 Ventricular premature depolarization: Secondary | ICD-10-CM | POA: Diagnosis not present

## 2024-02-04 DIAGNOSIS — E890 Postprocedural hypothyroidism: Secondary | ICD-10-CM | POA: Diagnosis not present

## 2024-02-04 DIAGNOSIS — R5382 Chronic fatigue, unspecified: Secondary | ICD-10-CM | POA: Diagnosis not present

## 2024-02-04 DIAGNOSIS — Z0189 Encounter for other specified special examinations: Secondary | ICD-10-CM | POA: Diagnosis not present

## 2024-02-05 DIAGNOSIS — R638 Other symptoms and signs concerning food and fluid intake: Secondary | ICD-10-CM | POA: Diagnosis not present

## 2024-02-05 DIAGNOSIS — Z0000 Encounter for general adult medical examination without abnormal findings: Secondary | ICD-10-CM | POA: Diagnosis not present

## 2024-02-05 DIAGNOSIS — R634 Abnormal weight loss: Secondary | ICD-10-CM | POA: Diagnosis not present

## 2024-02-05 DIAGNOSIS — R5382 Chronic fatigue, unspecified: Secondary | ICD-10-CM | POA: Diagnosis not present

## 2024-02-05 DIAGNOSIS — E890 Postprocedural hypothyroidism: Secondary | ICD-10-CM | POA: Diagnosis not present

## 2024-02-17 DIAGNOSIS — R8290 Unspecified abnormal findings in urine: Secondary | ICD-10-CM | POA: Diagnosis not present

## 2024-02-17 DIAGNOSIS — E782 Mixed hyperlipidemia: Secondary | ICD-10-CM | POA: Diagnosis not present

## 2024-02-18 DIAGNOSIS — N1831 Chronic kidney disease, stage 3a: Secondary | ICD-10-CM | POA: Diagnosis not present

## 2024-02-18 DIAGNOSIS — G894 Chronic pain syndrome: Secondary | ICD-10-CM | POA: Diagnosis not present

## 2024-02-18 DIAGNOSIS — K5909 Other constipation: Secondary | ICD-10-CM | POA: Diagnosis not present

## 2024-02-18 DIAGNOSIS — R634 Abnormal weight loss: Secondary | ICD-10-CM | POA: Diagnosis not present

## 2024-02-22 DIAGNOSIS — R32 Unspecified urinary incontinence: Secondary | ICD-10-CM | POA: Diagnosis not present

## 2024-02-22 DIAGNOSIS — M545 Low back pain, unspecified: Secondary | ICD-10-CM | POA: Diagnosis not present

## 2024-02-22 DIAGNOSIS — R634 Abnormal weight loss: Secondary | ICD-10-CM | POA: Diagnosis not present

## 2024-02-22 DIAGNOSIS — K5909 Other constipation: Secondary | ICD-10-CM | POA: Diagnosis not present

## 2024-02-22 DIAGNOSIS — I10 Essential (primary) hypertension: Secondary | ICD-10-CM | POA: Diagnosis not present

## 2024-02-22 DIAGNOSIS — E782 Mixed hyperlipidemia: Secondary | ICD-10-CM | POA: Diagnosis not present

## 2024-02-22 DIAGNOSIS — G894 Chronic pain syndrome: Secondary | ICD-10-CM | POA: Diagnosis not present

## 2024-02-22 DIAGNOSIS — I493 Ventricular premature depolarization: Secondary | ICD-10-CM | POA: Diagnosis not present

## 2024-02-22 DIAGNOSIS — E890 Postprocedural hypothyroidism: Secondary | ICD-10-CM | POA: Diagnosis not present

## 2024-02-29 DIAGNOSIS — K5909 Other constipation: Secondary | ICD-10-CM | POA: Diagnosis not present

## 2024-02-29 DIAGNOSIS — E782 Mixed hyperlipidemia: Secondary | ICD-10-CM | POA: Diagnosis not present

## 2024-02-29 DIAGNOSIS — I493 Ventricular premature depolarization: Secondary | ICD-10-CM | POA: Diagnosis not present

## 2024-02-29 DIAGNOSIS — M545 Low back pain, unspecified: Secondary | ICD-10-CM | POA: Diagnosis not present

## 2024-02-29 DIAGNOSIS — G894 Chronic pain syndrome: Secondary | ICD-10-CM | POA: Diagnosis not present

## 2024-02-29 DIAGNOSIS — R32 Unspecified urinary incontinence: Secondary | ICD-10-CM | POA: Diagnosis not present

## 2024-02-29 DIAGNOSIS — I10 Essential (primary) hypertension: Secondary | ICD-10-CM | POA: Diagnosis not present

## 2024-02-29 DIAGNOSIS — R634 Abnormal weight loss: Secondary | ICD-10-CM | POA: Diagnosis not present

## 2024-02-29 DIAGNOSIS — E890 Postprocedural hypothyroidism: Secondary | ICD-10-CM | POA: Diagnosis not present

## 2024-03-02 DIAGNOSIS — M545 Low back pain, unspecified: Secondary | ICD-10-CM | POA: Diagnosis not present

## 2024-03-02 DIAGNOSIS — G894 Chronic pain syndrome: Secondary | ICD-10-CM | POA: Diagnosis not present

## 2024-03-02 DIAGNOSIS — K5909 Other constipation: Secondary | ICD-10-CM | POA: Diagnosis not present

## 2024-03-02 DIAGNOSIS — R634 Abnormal weight loss: Secondary | ICD-10-CM | POA: Diagnosis not present

## 2024-03-02 DIAGNOSIS — E782 Mixed hyperlipidemia: Secondary | ICD-10-CM | POA: Diagnosis not present

## 2024-03-02 DIAGNOSIS — E890 Postprocedural hypothyroidism: Secondary | ICD-10-CM | POA: Diagnosis not present

## 2024-03-02 DIAGNOSIS — I10 Essential (primary) hypertension: Secondary | ICD-10-CM | POA: Diagnosis not present

## 2024-03-02 DIAGNOSIS — I493 Ventricular premature depolarization: Secondary | ICD-10-CM | POA: Diagnosis not present

## 2024-03-07 DIAGNOSIS — I10 Essential (primary) hypertension: Secondary | ICD-10-CM | POA: Diagnosis not present

## 2024-03-07 DIAGNOSIS — G894 Chronic pain syndrome: Secondary | ICD-10-CM | POA: Diagnosis not present

## 2024-03-07 DIAGNOSIS — R32 Unspecified urinary incontinence: Secondary | ICD-10-CM | POA: Diagnosis not present

## 2024-03-07 DIAGNOSIS — M545 Low back pain, unspecified: Secondary | ICD-10-CM | POA: Diagnosis not present

## 2024-03-07 DIAGNOSIS — I493 Ventricular premature depolarization: Secondary | ICD-10-CM | POA: Diagnosis not present

## 2024-03-07 DIAGNOSIS — R634 Abnormal weight loss: Secondary | ICD-10-CM | POA: Diagnosis not present

## 2024-03-07 DIAGNOSIS — K5909 Other constipation: Secondary | ICD-10-CM | POA: Diagnosis not present

## 2024-03-07 DIAGNOSIS — E782 Mixed hyperlipidemia: Secondary | ICD-10-CM | POA: Diagnosis not present

## 2024-03-14 DIAGNOSIS — I493 Ventricular premature depolarization: Secondary | ICD-10-CM | POA: Diagnosis not present

## 2024-03-18 DIAGNOSIS — E890 Postprocedural hypothyroidism: Secondary | ICD-10-CM | POA: Diagnosis not present

## 2024-03-18 DIAGNOSIS — E782 Mixed hyperlipidemia: Secondary | ICD-10-CM | POA: Diagnosis not present

## 2024-03-18 DIAGNOSIS — R634 Abnormal weight loss: Secondary | ICD-10-CM | POA: Diagnosis not present

## 2024-03-18 DIAGNOSIS — R32 Unspecified urinary incontinence: Secondary | ICD-10-CM | POA: Diagnosis not present

## 2024-03-18 DIAGNOSIS — M545 Low back pain, unspecified: Secondary | ICD-10-CM | POA: Diagnosis not present

## 2024-03-18 DIAGNOSIS — I493 Ventricular premature depolarization: Secondary | ICD-10-CM | POA: Diagnosis not present

## 2024-03-18 DIAGNOSIS — K5909 Other constipation: Secondary | ICD-10-CM | POA: Diagnosis not present

## 2024-03-18 DIAGNOSIS — I10 Essential (primary) hypertension: Secondary | ICD-10-CM | POA: Diagnosis not present

## 2024-03-18 DIAGNOSIS — G894 Chronic pain syndrome: Secondary | ICD-10-CM | POA: Diagnosis not present

## 2024-03-22 DIAGNOSIS — E782 Mixed hyperlipidemia: Secondary | ICD-10-CM | POA: Diagnosis not present

## 2024-03-22 DIAGNOSIS — M545 Low back pain, unspecified: Secondary | ICD-10-CM | POA: Diagnosis not present

## 2024-03-22 DIAGNOSIS — R634 Abnormal weight loss: Secondary | ICD-10-CM | POA: Diagnosis not present

## 2024-03-22 DIAGNOSIS — I493 Ventricular premature depolarization: Secondary | ICD-10-CM | POA: Diagnosis not present

## 2024-03-22 DIAGNOSIS — E890 Postprocedural hypothyroidism: Secondary | ICD-10-CM | POA: Diagnosis not present

## 2024-03-22 DIAGNOSIS — R32 Unspecified urinary incontinence: Secondary | ICD-10-CM | POA: Diagnosis not present

## 2024-03-22 DIAGNOSIS — I10 Essential (primary) hypertension: Secondary | ICD-10-CM | POA: Diagnosis not present

## 2024-03-22 DIAGNOSIS — G894 Chronic pain syndrome: Secondary | ICD-10-CM | POA: Diagnosis not present

## 2024-03-22 DIAGNOSIS — K5909 Other constipation: Secondary | ICD-10-CM | POA: Diagnosis not present

## 2024-03-23 DIAGNOSIS — K5909 Other constipation: Secondary | ICD-10-CM | POA: Diagnosis not present

## 2024-03-23 DIAGNOSIS — I493 Ventricular premature depolarization: Secondary | ICD-10-CM | POA: Diagnosis not present

## 2024-03-23 DIAGNOSIS — G894 Chronic pain syndrome: Secondary | ICD-10-CM | POA: Diagnosis not present

## 2024-03-23 DIAGNOSIS — I10 Essential (primary) hypertension: Secondary | ICD-10-CM | POA: Diagnosis not present

## 2024-03-23 DIAGNOSIS — R32 Unspecified urinary incontinence: Secondary | ICD-10-CM | POA: Diagnosis not present

## 2024-03-23 DIAGNOSIS — R634 Abnormal weight loss: Secondary | ICD-10-CM | POA: Diagnosis not present

## 2024-03-23 DIAGNOSIS — E782 Mixed hyperlipidemia: Secondary | ICD-10-CM | POA: Diagnosis not present

## 2024-03-23 DIAGNOSIS — E890 Postprocedural hypothyroidism: Secondary | ICD-10-CM | POA: Diagnosis not present

## 2024-03-23 DIAGNOSIS — M545 Low back pain, unspecified: Secondary | ICD-10-CM | POA: Diagnosis not present

## 2024-03-25 DIAGNOSIS — R634 Abnormal weight loss: Secondary | ICD-10-CM | POA: Diagnosis not present

## 2024-03-25 DIAGNOSIS — I10 Essential (primary) hypertension: Secondary | ICD-10-CM | POA: Diagnosis not present

## 2024-03-25 DIAGNOSIS — R32 Unspecified urinary incontinence: Secondary | ICD-10-CM | POA: Diagnosis not present

## 2024-03-25 DIAGNOSIS — K5909 Other constipation: Secondary | ICD-10-CM | POA: Diagnosis not present

## 2024-03-25 DIAGNOSIS — I493 Ventricular premature depolarization: Secondary | ICD-10-CM | POA: Diagnosis not present

## 2024-03-25 DIAGNOSIS — E890 Postprocedural hypothyroidism: Secondary | ICD-10-CM | POA: Diagnosis not present

## 2024-03-25 DIAGNOSIS — G894 Chronic pain syndrome: Secondary | ICD-10-CM | POA: Diagnosis not present

## 2024-03-25 DIAGNOSIS — M545 Low back pain, unspecified: Secondary | ICD-10-CM | POA: Diagnosis not present

## 2024-03-25 DIAGNOSIS — E782 Mixed hyperlipidemia: Secondary | ICD-10-CM | POA: Diagnosis not present

## 2024-04-05 DIAGNOSIS — E890 Postprocedural hypothyroidism: Secondary | ICD-10-CM | POA: Diagnosis not present
